# Patient Record
Sex: Male | Born: 1985
Health system: Southern US, Community
[De-identification: ages and names within clinical notes are randomized; demographics above are authoritative.]

## PROBLEM LIST (undated history)

## (undated) DIAGNOSIS — I10 Essential (primary) hypertension: Secondary | ICD-10-CM

---
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
## 2008-08-01 ENCOUNTER — Emergency Department: Payer: Self-pay | Admitting: Internal Medicine

## 2008-12-27 ENCOUNTER — Emergency Department: Payer: Self-pay | Admitting: Emergency Medicine

## 2010-05-08 ENCOUNTER — Emergency Department: Payer: Self-pay | Admitting: Emergency Medicine

## 2011-07-30 ENCOUNTER — Emergency Department: Payer: Self-pay | Admitting: *Deleted

## 2011-07-30 LAB — COMPREHENSIVE METABOLIC PANEL
Anion Gap: 4 — ABNORMAL LOW (ref 7–16)
BUN: 17 mg/dL (ref 7–18)
Calcium, Total: 9.1 mg/dL (ref 8.5–10.1)
Co2: 31 mmol/L (ref 21–32)
Creatinine: 1.27 mg/dL (ref 0.60–1.30)
Osmolality: 278 (ref 275–301)
SGOT(AST): 42 U/L — ABNORMAL HIGH (ref 15–37)

## 2011-07-30 LAB — URINALYSIS, COMPLETE
Bacteria: NONE SEEN
Bilirubin,UR: NEGATIVE
Blood: NEGATIVE
Ketone: NEGATIVE
Leukocyte Esterase: NEGATIVE
Nitrite: NEGATIVE
Ph: 5 (ref 4.5–8.0)
Protein: NEGATIVE
RBC,UR: <1 /HPF (ref 0–5)
Specific Gravity: 1.024 (ref 1.003–1.030)
Squamous Epithelial: <1
WBC UR: 1 /HPF (ref 0–5)

## 2011-07-30 LAB — LIPASE, BLOOD: Lipase: 127 U/L (ref 73–393)

## 2011-07-30 LAB — CBC
Platelet: 149 10*3/uL — ABNORMAL LOW (ref 150–440)
RBC: 4.86 10*6/uL (ref 4.40–5.90)
RDW: 14.4 % (ref 11.5–14.5)
WBC: 8 10*3/uL (ref 3.8–10.6)

## 2012-07-27 ENCOUNTER — Emergency Department: Payer: Self-pay | Admitting: Emergency Medicine

## 2013-01-23 ENCOUNTER — Emergency Department (HOSPITAL_COMMUNITY)
Admission: EM | Admit: 2013-01-23 | Discharge: 2013-01-23 | Discharge status: Against Medical Advice | Payer: Self-pay | Attending: Emergency Medicine | Admitting: Emergency Medicine

## 2013-01-23 ENCOUNTER — Encounter (HOSPITAL_COMMUNITY): Payer: Self-pay | Admitting: Emergency Medicine

## 2013-01-23 DIAGNOSIS — R112 Nausea with vomiting, unspecified: Secondary | ICD-10-CM | POA: Insufficient documentation

## 2013-01-23 DIAGNOSIS — F17200 Nicotine dependence, unspecified, uncomplicated: Secondary | ICD-10-CM | POA: Insufficient documentation

## 2013-01-23 DIAGNOSIS — R197 Diarrhea, unspecified: Secondary | ICD-10-CM | POA: Insufficient documentation

## 2013-01-23 DIAGNOSIS — R109 Unspecified abdominal pain: Secondary | ICD-10-CM | POA: Insufficient documentation

## 2013-01-23 NOTE — ED Notes (Signed)
Pt called back to have labs drawn but pt was not able to be found in main waiting room area

## 2013-01-23 NOTE — ED Notes (Signed)
Per pt sts since this am he has been having abdominal pain, N,V. sts he has been drinking water.

## 2013-08-07 ENCOUNTER — Emergency Department: Payer: Self-pay | Admitting: Emergency Medicine

## 2016-02-03 ENCOUNTER — Emergency Department
Admission: EM | Admit: 2016-02-03 | Discharge: 2016-02-03 | Disposition: A | Discharge status: Home / Self Care | Payer: Self-pay | Attending: Emergency Medicine | Admitting: Emergency Medicine

## 2016-02-03 DIAGNOSIS — F17200 Nicotine dependence, unspecified, uncomplicated: Secondary | ICD-10-CM | POA: Insufficient documentation

## 2016-02-03 DIAGNOSIS — B349 Viral infection, unspecified: Secondary | ICD-10-CM | POA: Insufficient documentation

## 2016-02-03 DIAGNOSIS — I10 Essential (primary) hypertension: Secondary | ICD-10-CM | POA: Insufficient documentation

## 2016-02-03 LAB — CBC
HCT: 45.6 % (ref 40.0–52.0)
Hemoglobin: 15.2 g/dL (ref 13.0–18.0)
MCH: 29.7 pg (ref 26.0–34.0)
MCHC: 33.4 g/dL (ref 32.0–36.0)
MCV: 88.7 fL (ref 80.0–100.0)
Platelets: 159 10*3/uL (ref 150–440)
RBC: 5.14 MIL/uL (ref 4.40–5.90)
RDW: 13.9 % (ref 11.5–14.5)
WBC: 11.3 10*3/uL — ABNORMAL HIGH (ref 3.8–10.6)

## 2016-02-03 LAB — LIPASE, BLOOD: Lipase: 14 U/L (ref 11–51)

## 2016-02-03 LAB — URINALYSIS, COMPLETE (UACMP) WITH MICROSCOPIC
Bacteria, UA: NONE SEEN
Bilirubin Urine: NEGATIVE
GLUCOSE, UA: NEGATIVE mg/dL
Hgb urine dipstick: NEGATIVE
Ketones, ur: NEGATIVE mg/dL
Leukocytes, UA: NEGATIVE
Nitrite: NEGATIVE
PROTEIN: NEGATIVE mg/dL
RBC / HPF: NONE SEEN RBC/hpf (ref 0–5)
SPECIFIC GRAVITY, URINE: 1.02 (ref 1.005–1.030)
pH: 5 (ref 5.0–8.0)

## 2016-02-03 LAB — COMPREHENSIVE METABOLIC PANEL
ALK PHOS: 75 U/L (ref 38–126)
ALT: 15 U/L — ABNORMAL LOW (ref 17–63)
ANION GAP: 5 (ref 5–15)
AST: 21 U/L (ref 15–41)
Albumin: 4.5 g/dL (ref 3.5–5.0)
BUN: 15 mg/dL (ref 6–20)
CALCIUM: 9.5 mg/dL (ref 8.9–10.3)
CHLORIDE: 104 mmol/L (ref 101–111)
CO2: 30 mmol/L (ref 22–32)
Creatinine, Ser: 1.42 mg/dL — ABNORMAL HIGH (ref 0.61–1.24)
GFR calc Af Amer: >60 mL/min (ref >60)
GFR calc non Af Amer: >60 mL/min (ref >60)
Glucose, Bld: 95 mg/dL (ref 65–99)
Potassium: 3.7 mmol/L (ref 3.5–5.1)
SODIUM: 139 mmol/L (ref 135–145)
Total Bilirubin: 0.7 mg/dL (ref 0.3–1.2)
Total Protein: 8 g/dL (ref 6.5–8.1)

## 2016-02-03 MED ORDER — ONDANSETRON 4 MG PO TBDP: ORAL_TABLET | ORAL | 0 refills | Status: DC

## 2016-02-03 NOTE — ED Provider Notes (Signed)
[facility] Emergency Department Provider Note  ____________________________________________   First MD Initiated Contact with Patient 02/03/16 2153     (approximate)  I have reviewed the triage vital signs and the nursing notes.   HISTORY  Chief Complaint Weakness    HPI David Giles is a 30 y.o. male who is otherwise healthy who presents for evaluation of acute onset of viral symptoms that started yesterday.  He reports congestion, watery eyes, runny nose, mild cough, body aches, objective fever, generalized weakness, generalized fatigue that has been moderate to severe in intensity.  Today he felt generally weak and was nauseated and then vomited twice.  He denieschest pain, shortness of breath, abdominal pain, diarrhea, dysuria.  He has felt some aching in his lower back bilaterally and has no history of trauma.  He states he feels like he has the flu.  Nothing is making his symptoms worse nor better.  He has been able to tolerate liquids since vomiting.   No past medical history on file.  There are no active problems to display for this patient.   No past surgical history on file.  Prior to Admission medications   Medication Sig Start Date End Date Taking? Authorizing Provider  ondansetron (ZOFRAN ODT) 4 MG disintegrating tablet Allow 1-2 tablets to dissolve in your mouth every 8 hours as needed for nausea/vomiting 02/03/16   Loleta Roseory Aidee Latimore, MD    Allergies Patient has no known allergies.  No family history on file.  Social History Social History  Substance Use Topics  . Smoking status: Current Every Day Smoker  . Smokeless tobacco: Not on file  . Alcohol use Yes    Review of Systems Constitutional: Subjective fever/chills Eyes: No visual changes.  Watery eyes bilaterally ENT: No sore throat.  Nasal congestion/runny nose Cardiovascular: Denies chest pain. Respiratory: Denies shortness of breath.  Mild nonproductive  cough Gastrointestinal: No abdominal pain.  Emesis 2.  No diarrhea.  No constipation. Genitourinary: Negative for dysuria. Musculoskeletal: Mild bilateral low back pain Skin: Negative for rash. Neurological: Negative for headaches, focal weakness or numbness.  10-point ROS otherwise negative.  ____________________________________________   PHYSICAL EXAM:  VITAL SIGNS: ED Triage Vitals  Enc Vitals Group     BP 02/03/16 2035 (!) 172/110     Pulse Rate 02/03/16 2034 (!) 118     Resp 02/03/16 2034 18     Temp 02/03/16 2034 99.6 F (37.6 C)     Temp Source 02/03/16 2034 Oral     SpO2 02/03/16 2034 96 %     Weight 02/03/16 2034 275 lb (124.7 kg)     Height 02/03/16 2034 5\' 10"  (1.778 m)     Head Circumference --      Peak Flow --      Pain Score 02/03/16 2034 5     Pain Loc --      Pain Edu? --      Excl. in GC? --     Constitutional: Alert and oriented. No acute distress but with obvious URI symptoms Eyes: Conjunctivae are injected bilaterally and watery. PERRL. EOMI. Head: Atraumatic. Nose: No congestion/rhinnorhea. Mouth/Throat: Mucous membranes are moist.  Oropharynx non-erythematous.  No exudate and no petechiae on the palate Neck: No stridor.  No meningeal signs.   Cardiovascular: Normal rate (triage tachycardia resolved), regular rhythm. Good peripheral circulation. Grossly normal heart sounds. Respiratory: Normal respiratory effort.  No retractions. Lungs CTAB. Gastrointestinal: Soft and nontender. No distention.  Musculoskeletal: No lower extremity tenderness nor  edema. No gross deformities of extremities. Neurologic:  Normal speech and language. No gross focal neurologic deficits are appreciated.  Skin:  Skin is warm, dry and intact. No rash noted. Psychiatric: Mood and affect are normal. Speech and behavior are normal.  ____________________________________________   LABS (all labs ordered are listed, but only abnormal results are displayed)  Labs Reviewed   CBC - Abnormal; Notable for the following:       Result Value   WBC 11.3 (*)    All other components within normal limits  COMPREHENSIVE METABOLIC PANEL - Abnormal; Notable for the following:    Creatinine, Ser 1.42 (*)    ALT 15 (*)    All other components within normal limits  URINALYSIS, COMPLETE (UACMP) WITH MICROSCOPIC - Abnormal; Notable for the following:    Color, Urine YELLOW (*)    APPearance CLEAR (*)    Squamous Epithelial / LPF 0-5 (*)    All other components within normal limits  LIPASE, BLOOD   ____________________________________________  EKG  None - EKG not ordered by ED physician ____________________________________________  RADIOLOGY   No results found.  ____________________________________________   PROCEDURES  Procedure(s) performed:   Procedures   Critical Care performed: No ____________________________________________   INITIAL IMPRESSION / ASSESSMENT AND PLAN / ED COURSE  Pertinent labs & imaging results that were available during my care of the patient were reviewed by me and considered in my medical decision making (see chart for details).  The patient has obvious viral symptoms most consistent with a flulike illness.  He has significant hypertension but that is likely chronic and/or related to his acute illness.  There is no benefit to checking influenza at this time given no effective treatment other than normal symptomatic treatment of any other viral illness.  There is no indication that he has an acute bacterial infection.  I had my usual and customary influenza-like illness discussion with the patient including return precautions.  I will provide him with a number for primary care doctor to follow up with.  He has a creatinine of 1.4 today and has lasted all labs was from 4 years ago with a creatinine of 1.2.  His GFR is still greater than 60.  In his paperwork and encouraged him to follow up as an outpatient to discuss his blood  pressure.  ____________________________________________  FINAL CLINICAL IMPRESSION(S) / ED DIAGNOSES  Final diagnoses:  Acute viral syndrome     MEDICATIONS GIVEN DURING THIS VISIT:  Medications - No data to display   NEW OUTPATIENT MEDICATIONS STARTED DURING THIS VISIT:  New Prescriptions   ONDANSETRON (ZOFRAN ODT) 4 MG DISINTEGRATING TABLET    Allow 1-2 tablets to dissolve in your mouth every 8 hours as needed for nausea/vomiting    Modified Medications   No medications on file    Discontinued Medications   No medications on file     Note:  This document was prepared using Dragon voice recognition software and may include unintentional dictation errors.    Loleta Roseory Kavi Almquist, MD 02/03/16 [phone]

## 2016-02-03 NOTE — Discharge Instructions (Signed)
You have been seen in the Emergency Department (ED) today for a likely viral illness.  Please drink plenty of clear fluids (water, Gatorade, chicken broth, etc).  You may use Tylenol and/or Motrin according to label instructions.  You can alternate between the two without any side effects.   Your blood pressure was elevated today too.  This may be normal for you and long-term, or if may be related to your illness, or both.  Please follow up to establish a primary care doctor at the next available opportunity to discuss your blood pressure, have it rechecked, and determine if you need to be on blood pressure medication (better determined when you are not ill with a virus).  Please follow up with your doctor as listed above.  Call your doctor or return to the Emergency Department (ED) if you are unable to tolerate fluids due to vomiting, have worsening trouble breathing, become extremely tired or difficult to awaken, or if you develop any other symptoms that concern you.

## 2016-02-03 NOTE — ED Triage Notes (Signed)
Pt in with co weakness and vomited x 2 today. No co of abd pain, and diarrhea. No dysuria at this time.

## 2018-04-05 ENCOUNTER — Other Ambulatory Visit: Payer: Self-pay

## 2018-04-05 ENCOUNTER — Encounter: Payer: Self-pay | Admitting: Emergency Medicine

## 2018-04-05 ENCOUNTER — Emergency Department
Admission: EM | Admit: 2018-04-05 | Discharge: 2018-04-05 | Disposition: A | Discharge status: Home / Self Care | Payer: Self-pay | Attending: Emergency Medicine | Admitting: Emergency Medicine

## 2018-04-05 DIAGNOSIS — F17210 Nicotine dependence, cigarettes, uncomplicated: Secondary | ICD-10-CM | POA: Insufficient documentation

## 2018-04-05 DIAGNOSIS — S61211A Laceration without foreign body of left index finger without damage to nail, initial encounter: Secondary | ICD-10-CM | POA: Insufficient documentation

## 2018-04-05 DIAGNOSIS — Z23 Encounter for immunization: Secondary | ICD-10-CM | POA: Insufficient documentation

## 2018-04-05 DIAGNOSIS — Y999 Unspecified external cause status: Secondary | ICD-10-CM | POA: Insufficient documentation

## 2018-04-05 DIAGNOSIS — Y939 Activity, unspecified: Secondary | ICD-10-CM | POA: Insufficient documentation

## 2018-04-05 DIAGNOSIS — W260XXA Contact with knife, initial encounter: Secondary | ICD-10-CM | POA: Insufficient documentation

## 2018-04-05 DIAGNOSIS — Y92009 Unspecified place in unspecified non-institutional (private) residence as the place of occurrence of the external cause: Secondary | ICD-10-CM | POA: Insufficient documentation

## 2018-04-05 MED ORDER — TETANUS-DIPHTH-ACELL PERTUSSIS 5-2.5-18.5 LF-MCG/0.5 IM SUSP
0.5000 mL | Freq: Once | INTRAMUSCULAR | Status: AC
  Administered 2018-04-05: 0.5 mL via INTRAMUSCULAR
  Filled 2018-04-05: qty 0.5

## 2018-04-05 MED ORDER — LIDOCAINE HCL (PF) 1 % IJ SOLN
INTRAMUSCULAR | Status: AC
  Filled 2018-04-05: qty 5

## 2018-04-05 MED ORDER — BACITRACIN-NEOMYCIN-POLYMYXIN 400-5-5000 EX OINT
TOPICAL_OINTMENT | Freq: Once | CUTANEOUS | Status: AC
  Administered 2018-04-05: 1 via TOPICAL
  Filled 2018-04-05: qty 1

## 2018-04-05 MED ORDER — LIDOCAINE HCL 2 % IJ SOLN
5.0000 mL | Freq: Once | INTRAMUSCULAR | Status: DC
  Administered 2018-04-05: 100 mg
  Filled 2018-04-05: qty 5

## 2018-04-05 MED ORDER — LIDOCAINE HCL (PF) 1 % IJ SOLN: 5.0000 mL | Freq: Once | INTRAMUSCULAR | Status: DC

## 2018-04-05 NOTE — ED Provider Notes (Signed)
[facility] Emergency Department Provider Note ____________________________________________  Time seen: 2000  I have reviewed the triage vital signs and the nursing notes.  HISTORY  Chief Complaint  Laceration   HPI David Giles is a 33 y.o. male presents to the ER today with complaint of laceration to left index finger.  He reports this occurred at approximately 12:00 today.  He cut his finger on his pocket knife.  He was able to control the bleeding at home.  He reports pain but denies numbness, tingling or weakness.  He has not taken anything over-the-counter for pain prior to arrival.  He does not know the last time he had a Tdap.  History reviewed. No pertinent past medical history.  There are no active problems to display for this patient.   History reviewed. No pertinent surgical history.  Prior to Admission medications   Medication Sig Start Date End Date Taking? Authorizing Provider  ondansetron (ZOFRAN ODT) 4 MG disintegrating tablet Allow 1-2 tablets to dissolve in your mouth every 8 hours as needed for nausea/vomiting 02/03/16   Loleta Rose, MD    Allergies Patient has no known allergies.  No family history on file.  Social History Social History   Tobacco Use  . Smoking status: Current Every Day Smoker  . Smokeless tobacco: Never Used  Substance Use Topics  . Alcohol use: Yes  . Drug use: Yes    Types: Marijuana    Review of Systems  Constitutional: Negative for fever. Cardiovascular: Negative for chest pain or chest tightness. Respiratory: Negative for shortness of breath. Musculoskeletal: Positive for finger pain.   Skin: Positive for laceration to right index finger. Neurological: Negative for focal weakness, tingling or numbness. ____________________________________________  PHYSICAL EXAM:  VITAL SIGNS: ED Triage Vitals  Enc Vitals Group     BP 04/05/18 1909 (!) 171/115     Pulse Rate 04/05/18 1909 80     Resp  04/05/18 1909 15     Temp 04/05/18 1909 98.5 F (36.9 C)     Temp Source 04/05/18 1909 Oral     SpO2 04/05/18 1909 98 %     Weight 04/05/18 1844 274 lb 14.6 oz (124.7 kg)     Height 04/05/18 1844 5\' 10"  (1.778 m)     Head Circumference --      Peak Flow --      Pain Score 04/05/18 1844 8     Pain Loc --      Pain Edu? --      Excl. in GC? --     Constitutional: Alert and oriented. Well appearing and in no distress. Cardiovascular: Normal rate, regular rhythm.  Radial pulses 2+ bilaterally.  Cap refill less than 3 seconds left index finger. Respiratory: Normal respiratory effort. No wheezes/rales/rhonchi. Musculoskeletal: Normal flexion and extension of the left index finger.  No joint swelling noted.  Neurologic: Sensation intact to BUE. Skin: 1.5 cm V shaped laceration to left index finger. ____________________________________________  PROCEDURES  .Marland KitchenLaceration Repair Date/Time: 04/05/2018 8:46 PM Performed by: Lorre Munroe, NP Authorized by: Lorre Munroe, NP   Consent:    Consent obtained:  Verbal   Consent given by:  Patient   Risks discussed:  Infection, need for additional repair, pain, poor cosmetic result and poor wound healing   Alternatives discussed:  No treatment and delayed treatment Universal protocol:    Procedure explained and questions answered to patient or proxy's satisfaction: yes     Relevant documents present and verified:  yes     Test results available and properly labeled: yes     Imaging studies available: yes     Required blood products, implants, devices, and special equipment available: yes     Site/side marked: yes     Immediately prior to procedure, a time out was called: yes     Patient identity confirmed:  Verbally with patient Anesthesia (see MAR for exact dosages):    Anesthesia method:  Local infiltration   Local anesthetic:  Lidocaine 1% w/o epi Laceration details:    Location:  Finger   Finger location:  L index finger   Length  (cm):  1.5 Repair type:    Repair type:  Simple Pre-procedure details:    Preparation:  Patient was prepped and draped in usual sterile fashion Exploration:    Hemostasis achieved with:  Direct pressure   Wound exploration: wound explored through full range of motion and entire depth of wound probed and visualized     Contaminated: no   Treatment:    Area cleansed with:  Betadine and saline   Amount of cleaning:  Standard   Irrigation solution:  Sterile saline   Irrigation method:  Syringe Skin repair:    Repair method:  Sutures   Suture size:  5-0   Suture material:  Nylon   Suture technique:  Simple interrupted   Number of sutures:  6 Approximation:    Approximation:  Close Post-procedure details:    Dressing:  Antibiotic ointment   Patient tolerance of procedure:  Tolerated well, no immediate complications   ____________________________________________  INITIAL IMPRESSION / ASSESSMENT AND PLAN / ED COURSE  Laceration Left Index Finger:  Tdap given today Sutures placed- see procedure note Aftercare instructions provided Advised him to return to ED or go to UC in 1 week for suture removal ____________________________________________  FINAL CLINICAL IMPRESSION(S) / ED DIAGNOSES  Final diagnoses:  Laceration of left index finger without foreign body without damage to nail, initial encounter    Nicki Reaper, NP    Lorre Munroe, NP 04/05/18 2103    Dionne Bucy, MD 04/05/18 [phone]

## 2018-04-05 NOTE — ED Notes (Signed)
Pt has was trying to open up his bedroom door with a knife when his hand slipped and cut his pointer finger on his right hand.

## 2018-04-05 NOTE — Discharge Instructions (Addendum)
You were seen today for finger laceration.  We gave you a tetanus booster today.  You received 6 stitches in your left index finger which will need to be removed in approximately 1 week.  Please follow-up sooner if you notice increased pain, swelling, redness, warmth or drainage from the area.  You may take her dressing off in about 2 hours.  After that keep area open to air.  You may apply Neosporin twice daily as needed.

## 2018-04-05 NOTE — ED Triage Notes (Signed)
C/O laceration to right index finger.     Bleeding controlled.  DSD applied.

## 2018-04-23 ENCOUNTER — Other Ambulatory Visit: Payer: Self-pay

## 2018-04-23 ENCOUNTER — Emergency Department
Admission: EM | Admit: 2018-04-23 | Discharge: 2018-04-23 | Disposition: A | Discharge status: Home / Self Care | Payer: Self-pay | Attending: Emergency Medicine | Admitting: Emergency Medicine

## 2018-04-23 DIAGNOSIS — A084 Viral intestinal infection, unspecified: Secondary | ICD-10-CM | POA: Insufficient documentation

## 2018-04-23 DIAGNOSIS — F17200 Nicotine dependence, unspecified, uncomplicated: Secondary | ICD-10-CM | POA: Insufficient documentation

## 2018-04-23 LAB — CBC WITH DIFFERENTIAL/PLATELET
Abs Immature Granulocytes: 0.01 10*3/uL (ref 0.00–0.07)
BASOS ABS: 0 10*3/uL (ref 0.0–0.1)
Basophils Relative: 1 %
Eosinophils Absolute: 0.2 10*3/uL (ref 0.0–0.5)
Eosinophils Relative: 3 %
HEMATOCRIT: 41.6 % (ref 39.0–52.0)
HEMOGLOBIN: 13.8 g/dL (ref 13.0–17.0)
Immature Granulocytes: 0 %
LYMPHS ABS: 2.1 10*3/uL (ref 0.7–4.0)
Lymphocytes Relative: 36 %
MCH: 29.1 pg (ref 26.0–34.0)
MCHC: 33.2 g/dL (ref 30.0–36.0)
MCV: 87.8 fL (ref 80.0–100.0)
MONO ABS: 0.5 10*3/uL (ref 0.1–1.0)
Monocytes Relative: 8 %
Neutro Abs: 3 10*3/uL (ref 1.7–7.7)
Neutrophils Relative %: 52 %
Platelets: 189 10*3/uL (ref 150–400)
RBC: 4.74 MIL/uL (ref 4.22–5.81)
RDW: 13 % (ref 11.5–15.5)
WBC: 5.7 10*3/uL (ref 4.0–10.5)
nRBC: 0 % (ref 0.0–0.2)

## 2018-04-23 LAB — URINALYSIS, COMPLETE (UACMP) WITH MICROSCOPIC
BACTERIA UA: NONE SEEN
BILIRUBIN URINE: NEGATIVE
Glucose, UA: NEGATIVE mg/dL
HGB URINE DIPSTICK: NEGATIVE
Ketones, ur: NEGATIVE mg/dL
LEUKOCYTE UA: NEGATIVE
NITRITE: NEGATIVE
Protein, ur: NEGATIVE mg/dL
SPECIFIC GRAVITY, URINE: 1.024 (ref 1.005–1.030)
Squamous Epithelial / LPF: NONE SEEN (ref 0–5)
pH: 6 (ref 5.0–8.0)

## 2018-04-23 LAB — COMPREHENSIVE METABOLIC PANEL
ALT: 18 U/L (ref 0–44)
ANION GAP: 7 (ref 5–15)
AST: 26 U/L (ref 15–41)
Albumin: 4.3 g/dL (ref 3.5–5.0)
Alkaline Phosphatase: 55 U/L (ref 38–126)
BILIRUBIN TOTAL: 0.4 mg/dL (ref 0.3–1.2)
BUN: 18 mg/dL (ref 6–20)
CO2: 29 mmol/L (ref 22–32)
Calcium: 8.8 mg/dL — ABNORMAL LOW (ref 8.9–10.3)
Chloride: 105 mmol/L (ref 98–111)
Creatinine, Ser: 1.19 mg/dL (ref 0.61–1.24)
GFR calc Af Amer: >60 mL/min (ref >60)
Glucose, Bld: 92 mg/dL (ref 70–99)
Potassium: 4 mmol/L (ref 3.5–5.1)
SODIUM: 141 mmol/L (ref 135–145)
Total Protein: 7.1 g/dL (ref 6.5–8.1)

## 2018-04-23 LAB — LIPASE, BLOOD: Lipase: 25 U/L (ref 11–51)

## 2018-04-23 MED ORDER — ONDANSETRON 4 MG PO TBDP: 4.0000 mg | ORAL_TABLET | Freq: Three times a day (TID) | ORAL | 0 refills | Status: DC | PRN

## 2018-04-23 MED ORDER — ONDANSETRON HCL 4 MG/2ML IJ SOLN
4.0000 mg | Freq: Once | INTRAMUSCULAR | Status: AC
  Administered 2018-04-23: 4 mg via INTRAVENOUS
  Filled 2018-04-23: qty 2

## 2018-04-23 MED ORDER — SODIUM CHLORIDE 0.9 % IV BOLUS
1000.0000 mL | Freq: Once | INTRAVENOUS | Status: AC
  Administered 2018-04-23: 1000 mL via INTRAVENOUS

## 2018-04-23 NOTE — ED Triage Notes (Signed)
Pt states "I think I have a stomach virus." symptoms began yesterday. 4 episodes of diarrhea, 3 episodes of vomiting. Denies fever. A&), ambulatory.

## 2018-04-23 NOTE — ED Notes (Signed)
Patient requesting to leave by 1300.

## 2018-04-23 NOTE — ED Provider Notes (Signed)
[facility] Emergency Department Provider Note  ____________________________________________  Time seen: Approximately 12:50 PM  I have reviewed the triage vital signs and the nursing notes.   HISTORY  Chief Complaint Diarrhea and Emesis   HPI David Giles is a 32 y.o. male with no significant past medical history who presents for evaluation of vomiting and diarrhea.  Patient reports that his symptoms started yesterday.  3 episodes of nonbloody nonbilious emesis and 4 of watery diarrhea.  No fever or chills, no cough, no chest pain or shortness of breath, no abdominal pain, no dysuria or hematuria.  Patient reports that his partner has had similar symptoms.  PMH None - reviewed  Prior to Admission medications   Medication Sig Start Date End Date Taking? Authorizing Provider  ondansetron (ZOFRAN ODT) 4 MG disintegrating tablet Take 1 tablet (4 mg total) by mouth every 8 (eight) hours as needed. 04/23/18   Nita Sickle, MD    Allergies Patient has no known allergies.  FH Heart attack Father    Hypertension Father    Stroke Father       Social History Social History   Tobacco Use  . Smoking status: Current Every Day Smoker  . Smokeless tobacco: Never Used  Substance Use Topics  . Alcohol use: Yes  . Drug use: Yes    Types: Marijuana    Review of Systems  Constitutional: Negative for fever. Eyes: Negative for visual changes. ENT: Negative for sore throat. Neck: No neck pain  Cardiovascular: Negative for chest pain. Respiratory: Negative for shortness of breath. Gastrointestinal: Negative for abdominal pain. + vomiting and diarrhea. Genitourinary: Negative for dysuria. Musculoskeletal: Negative for back pain. Skin: Negative for rash. Neurological: Negative for headaches, weakness or numbness. Psych: No SI or HI  ____________________________________________   PHYSICAL EXAM:  VITAL SIGNS: ED Triage Vitals  Enc Vitals  Group     BP 04/23/18 1148 (!) 174/120     Pulse Rate 04/23/18 1148 72     Resp 04/23/18 1148 16     Temp 04/23/18 1148 97.7 F (36.5 C)     Temp Source 04/23/18 1148 Oral     SpO2 04/23/18 1148 100 %     Weight 04/23/18 1148 245 lb (111.1 kg)     Height 04/23/18 1148 5\' 10"  (1.778 m)     Head Circumference --      Peak Flow --      Pain Score 04/23/18 1147 0     Pain Loc --      Pain Edu? --      Excl. in GC? --     Constitutional: Alert and oriented. Well appearing and in no apparent distress. HEENT:      Head: Normocephalic and atraumatic.         Eyes: Conjunctivae are normal. Sclera is non-icteric.       Mouth/Throat: Mucous membranes are moist.       Neck: Supple with no signs of meningismus. Cardiovascular: Regular rate and rhythm. No murmurs, gallops, or rubs. 2+ symmetrical distal pulses are present in all extremities. No JVD. Respiratory: Normal respiratory effort. Lungs are clear to auscultation bilaterally. No wheezes, crackles, or rhonchi.  Gastrointestinal: Soft, non tender, and non distended with positive bowel sounds. No rebound or guarding. Genitourinary: No CVA tenderness. Musculoskeletal: Nontender with normal range of motion in all extremities. No edema, cyanosis, or erythema of extremities. Neurologic: Normal speech and language. Face is symmetric. Moving all extremities. No gross focal neurologic deficits  are appreciated. Skin: Skin is warm, dry and intact. No rash noted. Psychiatric: Mood and affect are normal. Speech and behavior are normal.  ____________________________________________   LABS (all labs ordered are listed, but only abnormal results are displayed)  Labs Reviewed  COMPREHENSIVE METABOLIC PANEL - Abnormal; Notable for the following components:      Result Value   Calcium 8.8 (*)    All other components within normal limits  URINALYSIS, COMPLETE (UACMP) WITH MICROSCOPIC - Abnormal; Notable for the following components:   Color, Urine  YELLOW (*)    APPearance CLEAR (*)    All other components within normal limits  LIPASE, BLOOD  CBC WITH DIFFERENTIAL/PLATELET   ____________________________________________  EKG  none  ____________________________________________  RADIOLOGY  none  ____________________________________________   PROCEDURES  Procedure(s) performed: None Procedures Critical Care performed:  None ____________________________________________   INITIAL IMPRESSION / ASSESSMENT AND PLAN / ED COURSE  34 y.o. male with no significant past medical history who presents for evaluation of vomiting and diarrhea.  Patient is extremely well-appearing in no distress, has normal vital signs, abdomen is soft with no tenderness throughout.  Labs with no evidence of dehydration, AKI, electrolyte abnormalities, or sepsis.  Patient received fluids and Zofran, he is tolerating p.o. with no further episodes of vomiting.  Will discharge home with bland diet, increase oral hydration, Zofran for nausea and vomiting follow-up with primary care doctor.  Discussed and return precautions.      As part of my medical decision making, I reviewed the following data within the electronic MEDICAL RECORD NUMBER Nursing notes reviewed and incorporated, Labs reviewed , Old chart reviewed, Notes from prior ED visits and  Controlled Substance Database    Pertinent labs & imaging results that were available during my care of the patient were reviewed by me and considered in my medical decision making (see chart for details).    ____________________________________________   FINAL CLINICAL IMPRESSION(S) / ED DIAGNOSES  Final diagnoses:  Viral gastroenteritis      NEW MEDICATIONS STARTED DURING THIS VISIT:  ED Discharge Orders         Ordered    ondansetron (ZOFRAN ODT) 4 MG disintegrating tablet  Every 8 hours PRN     04/23/18 1250           Note:  This document was prepared using Dragon voice recognition software  and may include unintentional dictation errors.    Don Perking, [place], MD 04/23/18 [phone]

## 2018-04-23 NOTE — ED Triage Notes (Signed)
Diarrhea and vomiting since last night.  Says no fever.  Says one other family member with same.

## 2018-04-23 NOTE — ED Notes (Signed)
Patient was able to keep down 8 oz of water.

## 2018-08-10 ENCOUNTER — Other Ambulatory Visit: Payer: Self-pay

## 2018-08-10 ENCOUNTER — Emergency Department
Admission: EM | Admit: 2018-08-10 | Discharge: 2018-08-10 | Disposition: A | Discharge status: Home / Self Care | Payer: Self-pay | Attending: Emergency Medicine | Admitting: Emergency Medicine

## 2018-08-10 ENCOUNTER — Encounter: Payer: Self-pay | Admitting: Emergency Medicine

## 2018-08-10 DIAGNOSIS — F17200 Nicotine dependence, unspecified, uncomplicated: Secondary | ICD-10-CM | POA: Insufficient documentation

## 2018-08-10 DIAGNOSIS — R369 Urethral discharge, unspecified: Secondary | ICD-10-CM | POA: Insufficient documentation

## 2018-08-10 LAB — URINALYSIS, COMPLETE (UACMP) WITH MICROSCOPIC
Bacteria, UA: NONE SEEN
Bilirubin Urine: NEGATIVE
Glucose, UA: NEGATIVE mg/dL
Hgb urine dipstick: NEGATIVE
Ketones, ur: NEGATIVE mg/dL
Nitrite: NEGATIVE
Protein, ur: NEGATIVE mg/dL
Specific Gravity, Urine: 1.027 (ref 1.005–1.030)
Squamous Epithelial / HPF: NONE SEEN (ref 0–5)
pH: 5 (ref 5.0–8.0)

## 2018-08-10 LAB — CHLAMYDIA/NGC RT PCR (ARMC ONLY)
Chlamydia Tr: NOT DETECTED
N gonorrhoeae: NOT DETECTED

## 2018-08-10 MED ORDER — CEFTRIAXONE SODIUM 250 MG IJ SOLR
250.0000 mg | Freq: Once | INTRAMUSCULAR | Status: AC
  Administered 2018-08-10: 250 mg via INTRAMUSCULAR
  Filled 2018-08-10: qty 250

## 2018-08-10 MED ORDER — AZITHROMYCIN 500 MG PO TABS
1000.0000 mg | ORAL_TABLET | Freq: Once | ORAL | Status: AC
  Administered 2018-08-10: 1000 mg via ORAL
  Filled 2018-08-10: qty 2

## 2018-08-10 MED ORDER — DOXYCYCLINE HYCLATE 100 MG PO TABS: 100.0000 mg | ORAL_TABLET | Freq: Two times a day (BID) | ORAL | 0 refills | Status: DC

## 2018-08-10 NOTE — ED Triage Notes (Signed)
Milky white discharge from penis x 1 week.

## 2018-08-10 NOTE — ED Provider Notes (Signed)
[facility] Emergency Department Provider Note  ____________________________________________  Time seen: Approximately 3:47 PM  I have reviewed the triage vital signs and the nursing notes.   HISTORY  Chief Complaint Penile Discharge    HPI David Giles is a 33 y.o. male who presents emergency department complaining of a week penile discharge.  Patient reports that he has had penile discharge with urination as well as observed after waking.  Patient denies any dysuria, hematuria, abdominal pain, flank pain.  Patient was treated for STDs 2 months ago.  Patient reports at that time he had burning with urination.  No other complaints at this time.         History reviewed. No pertinent past medical history.  There are no active problems to display for this patient.   History reviewed. No pertinent surgical history.  Prior to Admission medications   Medication Sig Start Date End Date Taking? Authorizing Provider  doxycycline (VIBRA-TABS) 100 MG tablet Take 1 tablet (100 mg total) by mouth 2 (two) times daily. 08/10/18   , Delorise RoyalsJonathan D, PA-C  ondansetron (ZOFRAN ODT) 4 MG disintegrating tablet Take 1 tablet (4 mg total) by mouth every 8 (eight) hours as needed. 04/23/18   Nita SickleVeronese, [place], MD    Allergies Patient has no known allergies.  No family history on file.  Social History Social History   Tobacco Use  . Smoking status: Current Every Day Smoker  . Smokeless tobacco: Never Used  Substance Use Topics  . Alcohol use: Yes  . Drug use: Yes    Types: Marijuana     Review of Systems  Constitutional: No fever/chills Eyes: No visual changes. No discharge ENT: No upper respiratory complaints. Cardiovascular: no chest pain. Respiratory: no cough. No SOB. Gastrointestinal: No abdominal pain.  No nausea, no vomiting.  No diarrhea.  No constipation. Genitourinary: Negative for dysuria. No hematuria.  Positive for penile discharge.   Musculoskeletal: Negative for musculoskeletal pain. Skin: Negative for rash, abrasions, lacerations, ecchymosis. Neurological: Negative for headaches, focal weakness or numbness. 10-point ROS otherwise negative.  ____________________________________________   PHYSICAL EXAM:  VITAL SIGNS: ED Triage Vitals  Enc Vitals Group     BP 08/10/18 1500 (!) 143/99     Pulse Rate 08/10/18 1500 78     Resp 08/10/18 1500 20     Temp 08/10/18 1500 98.1 F (36.7 C)     Temp Source 08/10/18 1500 Oral     SpO2 08/10/18 1500 96 %     Weight 08/10/18 1502 265 lb (120.2 kg)     Height 08/10/18 1502 5\' 10"  (1.778 m)     Head Circumference --      Peak Flow --      Pain Score 08/10/18 1502 0     Pain Loc --      Pain Edu? --      Excl. in GC? --      Constitutional: Alert and oriented. Well appearing and in no acute distress. Eyes: Conjunctivae are normal. PERRL. EOMI. Head: Atraumatic. ENT:      Ears:       Nose: No congestion/rhinnorhea.      Mouth/Throat: Mucous membranes are moist.  Neck: No stridor.    Cardiovascular: Normal rate, regular rhythm. Normal S1 and S2.  Good peripheral circulation. Respiratory: Normal respiratory effort without tachypnea or retractions. Lungs CTAB. Good air entry to the bases with no decreased or absent breath sounds. Gastrointestinal: Bowel sounds 4 quadrants. Soft and nontender to palpation. No  guarding or rigidity. No palpable masses. No distention. No CVA tenderness. Musculoskeletal: Full range of motion to all extremities. No gross deformities appreciated. Neurologic:  Normal speech and language. No gross focal neurologic deficits are appreciated.  Skin:  Skin is warm, dry and intact. No rash noted. Psychiatric: Mood and affect are normal. Speech and behavior are normal. Patient exhibits appropriate insight and judgement.   ____________________________________________   LABS (all labs ordered are listed, but only abnormal results are  displayed)  Labs Reviewed  URINALYSIS, COMPLETE (UACMP) WITH MICROSCOPIC - Abnormal; Notable for the following components:      Result Value   Color, Urine YELLOW (*)    APPearance HAZY (*)    Leukocytes,Ua SMALL (*)    All other components within normal limits  CHLAMYDIA/NGC RT PCR ([facility] ONLY)   ____________________________________________  EKG   ____________________________________________  RADIOLOGY   No results found.  ____________________________________________    PROCEDURES  Procedure(s) performed:    Procedures    Medications  cefTRIAXone (ROCEPHIN) injection 250 mg (has no administration in time range)  azithromycin (ZITHROMAX) tablet 1,000 mg (has no administration in time range)     ____________________________________________   INITIAL IMPRESSION / ASSESSMENT AND PLAN / ED COURSE  Pertinent labs & imaging results that were available during my care of the patient were reviewed by me and considered in my medical decision making (see chart for details).  Review of the [place] CSRS was performed in accordance of the [place] prior to dispensing any controlled drugs.           Patient's diagnosis is consistent with anal discharge.  Patient presented to the emergency department complaining of a week of milky discharge.  Patient denies any dysuria, polyuria, hematuria.  Patient was treated recently for STDs.  Chlamydia and gonorrhea tests are have not returned at time of discharge.  Patient will be treated empirically with Rocephin and Zithromax.  I will also cover the patient with a week's worth of doxycycline for NGU, specifically for mycoplasma urealyticum.  Patient is to follow-up primary care or health department.  Patient is given ED precautions to return to the ED for any worsening or new symptoms.     ____________________________________________  FINAL CLINICAL IMPRESSION(S) / ED DIAGNOSES  Final diagnoses:  Penile discharge       NEW MEDICATIONS STARTED DURING THIS VISIT:  ED Discharge Orders         Ordered    doxycycline (VIBRA-TABS) 100 MG tablet  2 times daily     08/10/18 1635              This chart was dictated using voice recognition software/Dragon. Despite best efforts to proofread, errors can occur which can change the meaning. Any change was purely unintentional.    Darletta Moll, PA-C 08/10/18 1659    Schuyler Amor, MD 08/10/18 2251

## 2018-08-10 NOTE — ED Notes (Signed)
Pt c/o penile discharge since Tuesday. Pt asking if he will be out of here by 5pm. Informed him that I am not sure at this time. Informed him the provider would be in to see him as soon as possible. Advised pt he could follow up at health department as well during the week for further testing and for future issues if needed.

## 2019-08-15 ENCOUNTER — Encounter: Payer: Self-pay | Admitting: Emergency Medicine

## 2019-08-15 ENCOUNTER — Other Ambulatory Visit: Payer: Self-pay

## 2019-08-15 ENCOUNTER — Emergency Department
Admission: EM | Admit: 2019-08-15 | Discharge: 2019-08-15 | Disposition: A | Discharge status: Home / Self Care | Payer: Self-pay | Attending: Student in an Organized Health Care Education/Training Program | Admitting: Student in an Organized Health Care Education/Training Program

## 2019-08-15 ENCOUNTER — Emergency Department: Payer: Self-pay

## 2019-08-15 DIAGNOSIS — F17200 Nicotine dependence, unspecified, uncomplicated: Secondary | ICD-10-CM | POA: Insufficient documentation

## 2019-08-15 DIAGNOSIS — R079 Chest pain, unspecified: Secondary | ICD-10-CM | POA: Insufficient documentation

## 2019-08-15 LAB — BASIC METABOLIC PANEL
Anion gap: 8 (ref 5–15)
BUN: 20 mg/dL (ref 6–20)
CO2: 28 mmol/L (ref 22–32)
Calcium: 9 mg/dL (ref 8.9–10.3)
Chloride: 105 mmol/L (ref 98–111)
Creatinine, Ser: 1.29 mg/dL — ABNORMAL HIGH (ref 0.61–1.24)
GFR calc Af Amer: >60 mL/min (ref >60)
GFR calc non Af Amer: >60 mL/min (ref >60)
Glucose, Bld: 89 mg/dL (ref 70–99)
Potassium: 3.9 mmol/L (ref 3.5–5.1)
Sodium: 141 mmol/L (ref 135–145)

## 2019-08-15 LAB — CBC
HCT: 38.3 % — ABNORMAL LOW (ref 39.0–52.0)
Hemoglobin: 13.4 g/dL (ref 13.0–17.0)
MCH: 30 pg (ref 26.0–34.0)
MCHC: 35 g/dL (ref 30.0–36.0)
MCV: 85.9 fL (ref 80.0–100.0)
Platelets: 160 10*3/uL (ref 150–400)
RBC: 4.46 MIL/uL (ref 4.22–5.81)
RDW: 13.2 % (ref 11.5–15.5)
WBC: 6.5 10*3/uL (ref 4.0–10.5)
nRBC: 0 % (ref 0.0–0.2)

## 2019-08-15 LAB — TROPONIN I (HIGH SENSITIVITY)
Troponin I (High Sensitivity): 7 ng/L (ref <18)
Troponin I (High Sensitivity): 6 ng/L (ref <18)

## 2019-08-15 MED ORDER — HYDROCODONE-ACETAMINOPHEN 5-325 MG PO TABS: 1.0000 | ORAL_TABLET | ORAL | 0 refills | Status: DC | PRN

## 2019-08-15 MED ORDER — HYDROCODONE-ACETAMINOPHEN 5-325 MG PO TABS
1.0000 | ORAL_TABLET | Freq: Once | ORAL | Status: AC
  Administered 2019-08-15: 1 via ORAL
  Filled 2019-08-15: qty 1

## 2019-08-15 MED ORDER — CYCLOBENZAPRINE HCL 5 MG PO TABS: 5.0000 mg | ORAL_TABLET | Freq: Three times a day (TID) | ORAL | 0 refills | Status: DC | PRN

## 2019-08-15 MED ORDER — CYCLOBENZAPRINE HCL 10 MG PO TABS
5.0000 mg | ORAL_TABLET | Freq: Once | ORAL | Status: AC
  Administered 2019-08-15: 5 mg via ORAL
  Filled 2019-08-15: qty 1

## 2019-08-15 NOTE — ED Notes (Signed)
Pt ambulated to restroom without difficulty or distress.

## 2019-08-15 NOTE — ED Provider Notes (Signed)
[facility] Emergency Department Provider Note    First MD Initiated Contact with Patient 08/15/19 1458     (approximate)  I have reviewed the triage vital signs and the nursing notes.   HISTORY  Chief Complaint Chest Pain    HPI David Giles is a 34 y.o. male previously healthy young male who presents to the ER for evaluation of 3 days of right-sided chest pain worsened with motion.  States he does smoke 1 pack/day but is not felt any worsening shortness of breath wheezing or fevers.  No cough.  Denies any abdominal pain.  No nausea or vomiting.  Denies any pain ripping or tearing through to his back.  Denies any history of blood clots.  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  Denies feeling any popping sensation.    History reviewed. No pertinent past medical history. No family history on file. History reviewed. No pertinent surgical history. There are no problems to display for this patient.     Prior to Admission medications   Medication Sig Start Date End Date Taking? Authorizing Provider  doxycycline (VIBRA-TABS) 100 MG tablet Take 1 tablet (100 mg total) by mouth 2 (two) times daily. 08/10/18   David Giles, David Royals, PA-C  ondansetron (ZOFRAN ODT) 4 MG disintegrating tablet Take 1 tablet (4 mg total) by mouth every 8 (eight) hours as needed. 04/23/18   David Sickle, MD    Allergies Patient has no known allergies.    Social History Social History   Tobacco Use  . Smoking status: Current Every Day Smoker  . Smokeless tobacco: Never Used  Substance Use Topics  . Alcohol use: Yes  . Drug use: Yes    Types: Marijuana    Review of Systems Patient denies headaches, rhinorrhea, blurry vision, numbness, shortness of breath, chest pain, edema, cough, abdominal pain, nausea, vomiting, diarrhea, dysuria, fevers, rashes or hallucinations unless otherwise stated above in  HPI. ____________________________________________   PHYSICAL EXAM:  VITAL SIGNS: Vitals:   08/15/19 1116 08/15/19 1411  BP: (!) 168/110 (!) 158/110  Pulse: 74 64  Resp: 16 16  Temp: 98.2 F (36.8 C) 98.4 F (36.9 C)  SpO2: 97% 100%    Constitutional: Alert and oriented.  Eyes: Conjunctivae are normal.  Head: Atraumatic. Nose: No congestion/rhinnorhea. Mouth/Throat: Mucous membranes are moist.   Neck: No stridor. Painless ROM.  Cardiovascular: Normal rate, regular rhythm. Grossly normal heart sounds.  Good peripheral circulation. Respiratory: Normal respiratory effort.  No retractions. Lungs CTAB. Gastrointestinal: Soft and nontender. No distention. No abdominal bruits. No CVA tenderness. Genitourinary:  Musculoskeletal pain reproduced with palpation of the right pectoralis muscle.  No overlying cellulitis or crepitus.  No lower extremity tenderness nor edema.  No joint effusions. Neurologic:  Normal speech and language. No gross focal neurologic deficits are appreciated. No facial droop Skin:  Skin is warm, dry and intact. No rash noted. Psychiatric: Mood and affect are normal. Speech and behavior are normal.  ____________________________________________   LABS (all labs ordered are listed, but only abnormal results are displayed)  Results for orders placed or performed during the hospital encounter of 08/15/19 (from the past 24 hour(s))  Basic metabolic panel     Status: Abnormal   Collection Time: 08/15/19 11:25 AM  Result Value Ref Range   Sodium 141 135 - 145 mmol/L   Potassium 3.9 3.5 - 5.1 mmol/L   Chloride 105 98 - 111 mmol/L   CO2 28 22 -  32 mmol/L   Glucose, Bld 89 70 - 99 mg/dL   BUN 20 6 - 20 mg/dL   Creatinine, Ser 6.04 (H) 0.61 - 1.24 mg/dL   Calcium 9.0 8.9 - 54.0 mg/dL   GFR calc non Af Amer >60 >60 mL/min   GFR calc Af Amer >60 >60 mL/min   Anion gap 8 5 - 15  CBC     Status: Abnormal   Collection Time: 08/15/19 11:25 AM  Result Value Ref Range    WBC 6.5 4.0 - 10.5 K/uL   RBC 4.46 4.22 - 5.81 MIL/uL   Hemoglobin 13.4 13.0 - 17.0 g/dL   HCT 98.1 (L) 39 - 52 %   MCV 85.9 80.0 - 100.0 fL   MCH 30.0 26.0 - 34.0 pg   MCHC 35.0 30.0 - 36.0 g/dL   RDW 19.1 47.8 - 29.5 %   Platelets 160 150 - 400 K/uL   nRBC 0.0 0.0 - 0.2 %  Troponin I (High Sensitivity)     Status: None   Collection Time: 08/15/19 11:25 AM  Result Value Ref Range   Troponin I (High Sensitivity) 7 <18 ng/L   ____________________________________________  EKG My review and personal interpretation at Time:   11:11 Indication: Chest pain rate: 70 rhythm: Sinus Axis: rightward Other: Normal intervals, no stemi, no depressions ____________________________________________  RADIOLOGY  I personally reviewed all radiographic images ordered to evaluate for the above acute complaints and reviewed radiology reports and findings.  These findings were personally discussed with the patient.  Please see medical record for radiology report.  ____________________________________________   PROCEDURES  Procedure(s) performed:  Procedures    Critical Care performed: no ____________________________________________   INITIAL IMPRESSION / ASSESSMENT AND PLAN / ED COURSE  Pertinent labs & imaging results that were available during my care of the patient were reviewed by me and considered in my medical decision making (see chart for details).   DDX: ACS, pericarditis, esophagitis, boerhaaves, pe, dissection, pna, bronchitis, costochondritis   David Giles is a 34 y.o. who presents to the ED with symptoms as described above.  Patient is clinically very well-appearing now 3 days of persistent pain.  Initial troponin is negative.  EKG is nonischemic.  He is low risk by Wells criteria and is PERC negative.  His abdominal exam is soft and benign.  No white count.  I do suspect musculoskeletal strain.  Mildly elevated BP likely secondary to pain.  States history of BP being  elevated when visiting hospitalist and clinics.  Discussed need for follow up with PCP for repeat BP check.  Will treat with muscle laxatives as well as anti-inflammatory pain medication.  This does not seem consistent with acute intra-abdominal process, infection, pneumothorax, dissection.  Have discussed with the patient and available family all diagnostics and treatments performed thus far and all questions were answered to the best of my ability. The patient demonstrates understanding and agreement with plan.       The patient was evaluated in Emergency Department today for the symptoms described in the history of present illness. He/she was evaluated in the context of the global COVID-19 pandemic, which necessitated consideration that the patient might be at risk for infection with the SARS-CoV-2 virus that causes COVID-19. Institutional protocols and algorithms that pertain to the evaluation of patients at risk for COVID-19 are in a state of rapid change based on information released by regulatory bodies including the CDC and federal and state organizations. These policies and algorithms were followed  during the patient's care in the ED.  As part of my medical decision making, I reviewed the following data within the electronic MEDICAL RECORD NUMBER Nursing notes reviewed and incorporated, Labs reviewed, notes from prior ED visits and [place] Controlled Substance Database   ____________________________________________   FINAL CLINICAL IMPRESSION(S) / ED DIAGNOSES  Final diagnoses:  Chest pain, unspecified type      NEW MEDICATIONS STARTED DURING THIS VISIT:  New Prescriptions   No medications on file     Note:  This document was prepared using Dragon voice recognition software and may include unintentional dictation errors.    Willy Eddy, MD 08/15/19 [phone]

## 2019-08-15 NOTE — ED Notes (Signed)
Pt c/o right sided chest pain when he moves or takes a deep breath. Pt denies pain any other time. Pt states that he was lifting something heavy about 3 days ago when the pain started. Pt denies cardiac history. Does have history of high blood pressure

## 2019-08-15 NOTE — ED Triage Notes (Signed)
Pt reports pain to his right chest when he takes a deep breath for the last 3 days. Pt denies cough, congestion, recent injuries or other concerns.

## 2019-10-13 ENCOUNTER — Other Ambulatory Visit: Payer: Self-pay

## 2019-10-13 ENCOUNTER — Encounter: Payer: Self-pay | Admitting: Emergency Medicine

## 2019-10-13 ENCOUNTER — Emergency Department
Admission: EM | Admit: 2019-10-13 | Discharge: 2019-10-13 | Disposition: A | Discharge status: Home / Self Care | Payer: Self-pay | Attending: Emergency Medicine | Admitting: Emergency Medicine

## 2019-10-13 DIAGNOSIS — R0981 Nasal congestion: Secondary | ICD-10-CM | POA: Insufficient documentation

## 2019-10-13 DIAGNOSIS — F17200 Nicotine dependence, unspecified, uncomplicated: Secondary | ICD-10-CM | POA: Insufficient documentation

## 2019-10-13 MED ORDER — FLUTICASONE PROPIONATE 50 MCG/ACT NA SUSP: 2.0000 | Freq: Every day | NASAL | 0 refills | Status: AC

## 2019-10-13 NOTE — ED Provider Notes (Signed)
[facility] Emergency Department Provider Note   ____________________________________________   First MD Initiated Contact with Patient 10/13/19 1647     (approximate)  I have reviewed the triage vital signs and the nursing notes.   HISTORY  Chief Complaint Nasal Congestion   HPI Male David Giles is a 34 y.o. male presents to the ED with complaint of right-sided nasal congestion that has been clogged 2 days.  Patient states he was using cocaine when he felt that his nose was clogged up.  He denies any bleeding, coughing, fever, chills or Covid-like symptoms with this.  Patient states that he tried to use saline nose spray and had some improvement of his congestion before it closed up again.       History reviewed. No pertinent past medical history.  There are no problems to display for this patient.   History reviewed. No pertinent surgical history.  Prior to Admission medications   Medication Sig Start Date End Date Taking? Authorizing Provider  fluticasone (FLONASE) 50 MCG/ACT nasal spray Place 2 sprays into both nostrils daily. 10/13/19 10/12/20  Tommi Rumps, PA-C    Allergies Patient has no known allergies.  No family history on file.  Social History Social History   Tobacco Use  . Smoking status: Current Every Day Smoker  . Smokeless tobacco: Never Used  Substance Use Topics  . Alcohol use: Yes  . Drug use: Yes    Types: Marijuana, Cocaine    Review of Systems Constitutional: No fever/chills Eyes: No visual changes. ENT: Right sided nasal congestion. Cardiovascular: Denies chest pain. Respiratory: Denies shortness of breath. Genitourinary: Negative for dysuria. Musculoskeletal: Negative for back pain. Skin: Negative for rash. Neurological: Negative for headaches, focal weakness or numbness.   ____________________________________________   PHYSICAL EXAM:  VITAL SIGNS: ED Triage Vitals  Enc Vitals Group     BP  10/13/19 1623 (!) 165/89     Pulse Rate 10/13/19 1623 69     Resp 10/13/19 1623 16     Temp 10/13/19 1623 98.6 F (37 C)     Temp Source 10/13/19 1623 Oral     SpO2 10/13/19 1623 100 %     Weight 10/13/19 1624 265 lb (120.2 kg)     Height 10/13/19 1624 5\' 11"  (1.803 m)     Head Circumference --      Peak Flow --      Pain Score 10/13/19 1635 0     Pain Loc --      Pain Edu? --      Excl. in GC? --     Constitutional: Alert and oriented. Well appearing and in no acute distress. Eyes: Conjunctivae are normal. PERRL. EOMI. Head: Atraumatic. Nose: No rhinnorhea or bleeding.  Turbinates bilaterally are edematous with the right side being slightly larger than the left.  No mucus, bleeding or injury to the septum is noted. Mouth/Throat: Mucous membranes are moist.  Oropharynx non-erythematous. Neck: No stridor.   Cardiovascular: Normal rate, regular rhythm. Grossly normal heart sounds.  Good peripheral circulation. Respiratory: Normal respiratory effort.  No retractions. Lungs CTAB. Musculoskeletal: Normal gait was noted. Neurologic:  Normal speech and language. No gross focal neurologic deficits are appreciated. No gait instability. Skin:  Skin is warm, dry and intact. No rash noted. Psychiatric: Mood and affect are normal. Speech and behavior are normal.  ____________________________________________   LABS (all labs ordered are listed, but only abnormal results are displayed)  Labs Reviewed - No data to display  PROCEDURES  Procedure(s) performed (including Critical Care):  Procedures   ____________________________________________   INITIAL IMPRESSION / ASSESSMENT AND PLAN / ED COURSE  As part of my medical decision making, I reviewed the following data within the electronic MEDICAL RECORD NUMBER Notes from prior ED visits and [place] Controlled Substance Database    Qadir Bailon was evaluated in Emergency Department on 10/13/2019 for the symptoms described in the history of  present illness. He was evaluated in the context of the global COVID-19 pandemic, which necessitated consideration that the patient might be at risk for infection with the SARS-CoV-2 virus that causes COVID-19. Institutional protocols and algorithms that pertain to the evaluation of patients at risk for COVID-19 are in a state of rapid change based on information released by regulatory bodies including the CDC and federal and state organizations. These policies and algorithms were followed during the patient's care in the ED.  34 year old male presents to the ED with complaint of right-sided nasal congestion for the last 2 days.  Patient used cocaine prior to feeling that his nose was stopped up.  Patient is encouraged to discontinue use of cocaine at this time.  He was given a prescription for Flonase nasal spray to help reduce the swelling in his nose.  He is to follow-up with Dr. Lyn Hollingshead who is on-call for [facility] if any continued problems. ____________________________________________   FINAL CLINICAL IMPRESSION(S) / ED DIAGNOSES  Final diagnoses:  Nasal congestion     ED Discharge Orders         Ordered    fluticasone (FLONASE) 50 MCG/ACT nasal spray  Daily        10/13/19 1721           Note:  This document was prepared using Dragon voice recognition software and may include unintentional dictation errors.    Tommi Rumps, PA-C 10/13/19 1733    Gilles Chiquito, MD 10/13/19 [phone]

## 2019-10-13 NOTE — Discharge Instructions (Signed)
Discontinue using cocaine until your nose has improved.  Begin using Flonase nasal spray to each side of your nose once a day as directed.  If not improving in 1 week you should follow up with a ear nose and throat specialist.  The doctor on call for [facility] is Dr. Lyn Hollingshead and his contact information is listed on your discharge papers.

## 2019-10-13 NOTE — ED Notes (Signed)
See triage note  Presents with "feeling like his right side of his nose is clogged" admitted to doing cocaine

## 2019-10-13 NOTE — ED Triage Notes (Signed)
C/O right side of nose being clogged since Friday.  States was using cocaine and then felt nose was clogged.

## 2019-10-16 ENCOUNTER — Encounter: Payer: Self-pay | Admitting: Emergency Medicine

## 2019-10-16 ENCOUNTER — Other Ambulatory Visit: Payer: Self-pay

## 2019-10-16 ENCOUNTER — Emergency Department: Payer: Self-pay

## 2019-10-16 ENCOUNTER — Emergency Department
Admission: EM | Admit: 2019-10-16 | Discharge: 2019-10-16 | Disposition: A | Discharge status: Home / Self Care | Payer: Self-pay | Attending: Emergency Medicine | Admitting: Emergency Medicine

## 2019-10-16 DIAGNOSIS — Y9289 Other specified places as the place of occurrence of the external cause: Secondary | ICD-10-CM | POA: Insufficient documentation

## 2019-10-16 DIAGNOSIS — Y9389 Activity, other specified: Secondary | ICD-10-CM | POA: Insufficient documentation

## 2019-10-16 DIAGNOSIS — Y939 Activity, unspecified: Secondary | ICD-10-CM | POA: Insufficient documentation

## 2019-10-16 DIAGNOSIS — Y999 Unspecified external cause status: Secondary | ICD-10-CM | POA: Insufficient documentation

## 2019-10-16 DIAGNOSIS — F17200 Nicotine dependence, unspecified, uncomplicated: Secondary | ICD-10-CM | POA: Insufficient documentation

## 2019-10-16 DIAGNOSIS — X501XXA Overexertion from prolonged static or awkward postures, initial encounter: Secondary | ICD-10-CM | POA: Insufficient documentation

## 2019-10-16 DIAGNOSIS — S92301A Fracture of unspecified metatarsal bone(s), right foot, initial encounter for closed fracture: Secondary | ICD-10-CM | POA: Insufficient documentation

## 2019-10-16 MED ORDER — BACITRACIN-NEOMYCIN-POLYMYXIN 400-5-5000 EX OINT
TOPICAL_OINTMENT | Freq: Once | CUTANEOUS | Status: AC
  Filled 2019-10-16: qty 1

## 2019-10-16 MED ORDER — KETOROLAC TROMETHAMINE 10 MG PO TABS: 10.0000 mg | ORAL_TABLET | Freq: Four times a day (QID) | ORAL | 0 refills | Status: DC | PRN

## 2019-10-16 MED ORDER — KETOROLAC TROMETHAMINE 30 MG/ML IJ SOLN
30.0000 mg | Freq: Once | INTRAMUSCULAR | Status: AC
  Administered 2019-10-16: 30 mg via INTRAMUSCULAR
  Filled 2019-10-16: qty 1

## 2019-10-16 NOTE — ED Notes (Signed)
See triage note  Presents with pain to right foot  States running from a dog   Thinks he twisted his foot  Having pain with some swelling across toes  Small abrasion noted

## 2019-10-16 NOTE — Discharge Instructions (Signed)
You have broken your 3rd, 4th, and 5th metatarsals in your foot. Please wear boot and use crutches. Please call podiatry today for a follow up appointment.

## 2019-10-16 NOTE — ED Provider Notes (Signed)
[facility] Emergency Department Provider Note  ____________________________________________  Time seen: Approximately 9:54 AM  I have reviewed the triage vital signs and the nursing notes.   HISTORY  Chief Complaint Foot Pain    HPI David Giles is a 34 y.o. male that presents to the emergency department for evaluation of left foot pain after injury yesterday.  Patient was running from a dog and bent his foot backwards.  He has had continued pain and difficulty walking on foot since injury.  No additional injuries.   History reviewed. No pertinent past medical history.  There are no problems to display for this patient.   History reviewed. No pertinent surgical history.  Prior to Admission medications   Medication Sig Start Date End Date Taking? Authorizing Provider  fluticasone (FLONASE) 50 MCG/ACT nasal spray Place 2 sprays into both nostrils daily. 10/13/19 10/12/20  Tommi Rumps, PA-C  ketorolac (TORADOL) 10 MG tablet Take 1 tablet (10 mg total) by mouth every 6 (six) hours as needed. 10/16/19   Enid Derry, PA-C    Allergies Patient has no known allergies.  No family history on file.  Social History Social History   Tobacco Use  . Smoking status: Current Every Day Smoker  . Smokeless tobacco: Never Used  Substance Use Topics  . Alcohol use: Yes  . Drug use: Yes    Types: Marijuana, Cocaine     Review of Systems  Cardiovascular: No chest pain. Respiratory: No SOB. Gastrointestinal: No abdominal pain.  No nausea, no vomiting.  Musculoskeletal: Positive for foot pain. Skin: Negative for rash, abrasions, lacerations, ecchymosis. Neurological: Negative for numbness or tingling   ____________________________________________   PHYSICAL EXAM:  VITAL SIGNS: ED Triage Vitals  Enc Vitals Group     BP 10/16/19 0856 (!) 146/89     Pulse Rate 10/16/19 0856 70     Resp 10/16/19 0856 17     Temp 10/16/19 0856 98.2 F (36.8 C)      Temp Source 10/16/19 0856 Oral     SpO2 10/16/19 0856 100 %     Weight 10/16/19 0850 260 lb (117.9 kg)     Height 10/16/19 0850 5\' 10"  (1.778 m)     Head Circumference --      Peak Flow --      Pain Score 10/16/19 0850 8     Pain Loc --      Pain Edu? --      Excl. in GC? --      Constitutional: Alert and oriented. Well appearing and in no acute distress. Eyes: Conjunctivae are normal. PERRL. EOMI. Head: Atraumatic. ENT:      Ears:      Nose: No congestion/rhinnorhea.      Mouth/Throat: Mucous membranes are moist.  Neck: No stridor.  Cardiovascular: Normal rate, regular rhythm.  Good peripheral circulation. Respiratory: Normal respiratory effort without tachypnea or retractions. Lungs CTAB. Good air entry to the bases with no decreased or absent breath sounds. Musculoskeletal: Full range of motion to all extremities. No gross deformities appreciated.  Mild swelling to left foot with tenderness to palpation to midfoot and lateral foot. Neurologic:  Normal speech and language. No gross focal neurologic deficits are appreciated.  Skin:  Skin is warm, dry and intact. No rash noted. Psychiatric: Mood and affect are normal. Speech and behavior are normal. Patient exhibits appropriate insight and judgement.   ____________________________________________   LABS (all labs ordered are listed, but only abnormal results are displayed)  Labs  Reviewed - No data to display ____________________________________________  EKG   ____________________________________________  RADIOLOGY Lexine Baton, personally viewed and evaluated these images (plain radiographs) as part of my medical decision making, as well as reviewing the written report by the radiologist.  DG Foot Complete Left  Result Date: 10/16/2019 CLINICAL DATA:  Pain after twisting injury EXAM: LEFT FOOT - COMPLETE 3+ VIEW COMPARISON:  None. FINDINGS: Frontal, oblique, and lateral views obtained. No fracture or dislocation.  Joint spaces appear normal. No erosive change. There are small posterior and inferior calcaneal spurs. IMPRESSION: Calcaneal spurs. No appreciable joint space narrowing. No fracture or dislocation. Electronically Signed   By: Bretta Bang III M.D.   On: 10/16/2019 11:38   DG Foot Complete Right  Result Date: 10/16/2019 CLINICAL DATA:  Twisting injury while running, initial encounter EXAM: RIGHT FOOT COMPLETE - 3+ VIEW COMPARISON:  None. FINDINGS: Undisplaced fractures involving the distal aspects of the third, fourth and fifth metatarsals are seen. The fifth metatarsal fracture is subtle. Associated soft tissue swelling is noted. No other focal abnormality is seen. IMPRESSION: Distal third through fifth metatarsal fractures as described. Electronically Signed   By: Alcide Clever M.D.   On: 10/16/2019 09:51    ____________________________________________    PROCEDURES  Procedure(s) performed:    Procedures    Medications  neomycin-bacitracin-polymyxin (NEOSPORIN) ointment packet ( Topical Given 10/16/19 1108)  ketorolac (TORADOL) 30 MG/ML injection 30 mg (30 mg Intramuscular Given 10/16/19 1108)     ____________________________________________   INITIAL IMPRESSION / ASSESSMENT AND PLAN / ED COURSE  Pertinent labs & imaging results that were available during my care of the patient were reviewed by me and considered in my medical decision making (see chart for details).  Review of the [place] CSRS was performed in accordance of the [facility] prior to dispensing any controlled drugs.   Patient presented to the emergency department for evaluation of foot injury.  Vital signs and exam are reassuring.  Foot x-ray is remarkable for a distal third, fourth, fifth nondisplaced metatarsal fracture.  Cam boot was placed.  Crutches were given.  Patient will be discharged home with prescriptions for Toradol. Patient is to follow up with podiatry as directed. Patient is given ED precautions to return to  the ED for any worsening or new symptoms.   David Giles was evaluated in Emergency Department on 10/16/2019 for the symptoms described in the history of present illness. He was evaluated in the context of the global COVID-19 pandemic, which necessitated consideration that the patient might be at risk for infection with the SARS-CoV-2 virus that causes COVID-19. Institutional protocols and algorithms that pertain to the evaluation of patients at risk for COVID-19 are in a state of rapid change based on information released by regulatory bodies including the CDC and federal and state organizations. These policies and algorithms were followed during the patient's care in the ED.  ____________________________________________  FINAL CLINICAL IMPRESSION(S) / ED DIAGNOSES  Final diagnoses:  Closed nondisplaced fracture of metatarsal bone of right foot, unspecified metatarsal, initial encounter      NEW MEDICATIONS STARTED DURING THIS VISIT:  ED Discharge Orders         Ordered    ketorolac (TORADOL) 10 MG tablet  Every 6 hours PRN        10/16/19 1148              This chart was dictated using voice recognition software/Dragon. Despite best efforts to proofread, errors can occur which can change  the meaning. Any change was purely unintentional.    Enid Derry, PA-C 10/16/19 1526    Gilles Chiquito, MD 10/16/19 1530

## 2019-10-16 NOTE — ED Triage Notes (Addendum)
Pt brought over from [facility] with report that pt presented there and needed to be seen for an emergent situation so was advised to bring him to the ED.  Pt reports was running from a dog last pm and bent his foot and now with continued pain to his foot and numbness in his toes.

## 2019-10-20 ENCOUNTER — Emergency Department: Admission: EM | Admit: 2019-10-20 | Discharge: 2019-10-20 | Discharge status: Absent without leave | Payer: Self-pay

## 2020-02-08 ENCOUNTER — Encounter: Payer: Self-pay | Admitting: Emergency Medicine

## 2020-02-08 ENCOUNTER — Other Ambulatory Visit: Payer: Self-pay

## 2020-02-08 ENCOUNTER — Emergency Department
Admission: EM | Admit: 2020-02-08 | Discharge: 2020-02-08 | Disposition: A | Discharge status: Home / Self Care | Payer: Self-pay | Attending: Physician Assistant | Admitting: Physician Assistant

## 2020-02-08 DIAGNOSIS — R059 Cough, unspecified: Secondary | ICD-10-CM | POA: Insufficient documentation

## 2020-02-08 DIAGNOSIS — Z5321 Procedure and treatment not carried out due to patient leaving prior to being seen by health care provider: Secondary | ICD-10-CM | POA: Insufficient documentation

## 2020-02-08 NOTE — ED Notes (Signed)
Pt caled x's 3, no response

## 2020-02-08 NOTE — ED Triage Notes (Signed)
Pt reports a cough for 2 days and was exposed to covid

## 2020-02-08 NOTE — ED Triage Notes (Signed)
Pt called x's 2 from [facility] to treatment room, no reponse

## 2020-02-08 NOTE — ED Triage Notes (Signed)
Pt called from [facility] to treatment room, no response 

## 2020-02-09 ENCOUNTER — Emergency Department
Admission: EM | Admit: 2020-02-09 | Discharge: 2020-02-09 | Disposition: A | Discharge status: Home / Self Care | Payer: HRSA Program | Attending: Student in an Organized Health Care Education/Training Program | Admitting: Student in an Organized Health Care Education/Training Program

## 2020-02-09 ENCOUNTER — Other Ambulatory Visit: Payer: Self-pay

## 2020-02-09 ENCOUNTER — Encounter: Payer: Self-pay | Admitting: Emergency Medicine

## 2020-02-09 DIAGNOSIS — F17200 Nicotine dependence, unspecified, uncomplicated: Secondary | ICD-10-CM | POA: Diagnosis not present

## 2020-02-09 DIAGNOSIS — R030 Elevated blood-pressure reading, without diagnosis of hypertension: Secondary | ICD-10-CM | POA: Insufficient documentation

## 2020-02-09 DIAGNOSIS — U071 COVID-19: Secondary | ICD-10-CM | POA: Diagnosis not present

## 2020-02-09 DIAGNOSIS — Z20822 Contact with and (suspected) exposure to covid-19: Secondary | ICD-10-CM

## 2020-02-09 DIAGNOSIS — R059 Cough, unspecified: Secondary | ICD-10-CM | POA: Diagnosis present

## 2020-02-09 NOTE — ED Provider Notes (Signed)
[facility] Emergency Department Provider Note  ____________________________________________  Time seen: Approximately 11:48 AM  I have reviewed the triage vital signs and the nursing notes.   HISTORY  Chief Complaint Nasal Congestion and Cough    HPI David Giles is a 35 y.o. male that presents to the emergency department for evaluation of headache, nasal congestion, sore throat, nonproductive cough, fatigue for 2 days.  Patient received the Moderna vaccinations in October.  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  He works closely with them.  He presents today for a Covid test.  No shortness of breath, chest pain, vomiting, diarrhea.  History reviewed. No pertinent past medical history.  There are no problems to display for this patient.   History reviewed. No pertinent surgical history.  Prior to Admission medications   Medication Sig Start Date End Date Taking? Authorizing Provider  fluticasone (FLONASE) 50 MCG/ACT nasal spray Place 2 sprays into both nostrils daily. 10/13/19 10/12/20  Tommi Rumps, PA-C  ketorolac (TORADOL) 10 MG tablet Take 1 tablet (10 mg total) by mouth every 6 (six) hours as needed. 10/16/19   Enid Derry, PA-C    Allergies Patient has no known allergies.  No family history on file.  Social History Social History   Tobacco Use  . Smoking status: Current Every Day Smoker  . Smokeless tobacco: Never Used  Substance Use Topics  . Alcohol use: Yes  . Drug use: Yes    Types: Marijuana, Cocaine     Review of Systems  Constitutional: No fever/chills Eyes: No visual changes. No discharge. ENT: Positive for congestion and rhinorrhea. Positive for sore throat. Cardiovascular: No chest pain. Respiratory: Positive for cough. No SOB. Gastrointestinal: No abdominal pain.  No nausea, no vomiting.  No diarrhea.  No constipation. Musculoskeletal: Negative for musculoskeletal  pain. Skin: Negative for rash, abrasions, lacerations, ecchymosis. Neurological: Positive for headache.  ____________________________________________   PHYSICAL EXAM:  VITAL SIGNS: ED Triage Vitals  Enc Vitals Group     BP 02/09/20 1047 (!) 194/108     Pulse Rate 02/09/20 1047 70     Resp 02/09/20 1047 17     Temp 02/09/20 1047 98.7 F (37.1 C)     Temp Source 02/09/20 1047 Oral     SpO2 02/09/20 1047 100 %     Weight 02/09/20 1047 253 lb (114.8 kg)     Height 02/09/20 1047 5\' 10"  (1.778 m)     Head Circumference --      Peak Flow --      Pain Score 02/09/20 1053 7     Pain Loc --      Pain David? --      Excl. in GC? --      Constitutional: Alert and oriented. Well appearing and in no acute distress. Eyes: Conjunctivae are normal. PERRL. EOMI. No discharge. Head: Atraumatic. ENT: No frontal and maxillary sinus tenderness.      Ears: Tympanic membranes pearly gray with good landmarks. No discharge.      Nose: Mild congestion/rhinnorhea.      Mouth/Throat: Mucous membranes are moist. Oropharynx non-erythematous. Tonsils not enlarged. No exudates. Uvula midline. Neck: No stridor.   Hematological/Lymphatic/Immunilogical: No cervical lymphadenopathy. Cardiovascular: Normal rate, regular rhythm.  Good peripheral circulation. Respiratory: Normal respiratory effort without tachypnea or retractions. Lungs CTAB. Good air entry to the bases with no decreased or absent breath sounds. Gastrointestinal: Bowel sounds 4 quadrants. Soft and nontender to palpation. No  guarding or rigidity. No palpable masses. No distention. Musculoskeletal: Full range of motion to all extremities. No gross deformities appreciated. Neurologic:  Normal speech and language. No gross focal neurologic deficits are appreciated.  Skin:  Skin is warm, dry and intact. No rash noted. Psychiatric: Mood and affect are normal. Speech and behavior are normal. Patient exhibits appropriate insight and  judgement.   ____________________________________________   LABS (all labs ordered are listed, but only abnormal results are displayed)  Labs Reviewed  SARS CORONAVIRUS 2 (TAT 6-24 HRS)   ____________________________________________  EKG   ____________________________________________  RADIOLOGY   No results found.  ____________________________________________    PROCEDURES  Procedure(s) performed:    Procedures    Medications - No data to display   ____________________________________________   INITIAL IMPRESSION / ASSESSMENT AND PLAN / ED COURSE  Pertinent labs & imaging results that were available during my care of the patient were reviewed by me and considered in my medical decision making (see chart for details).  Review of the [place] CSRS was performed in accordance of the [facility] prior to dispensing any controlled drugs.   Patient's diagnosis is consistent with suspected Covid 19. Vital signs and exam are reassuring. Covid test is in process Patient appears well and is staying well hydrated. Patient should alternate tylenol and ibuprofen for fever. Patient feels comfortable going home. Patient is to follow up with PCP as needed or otherwise directed. Patient is given ED precautions to return to the ED for any worsening or new symptoms.   David Giles was evaluated in Emergency Department on 02/09/2020 for the symptoms described in the history of present illness. He was evaluated in the context of the global COVID-19 pandemic, which necessitated consideration that the patient might be at risk for infection with the SARS-CoV-2 virus that causes COVID-19. Institutional protocols and algorithms that pertain to the evaluation of patients at risk for COVID-19 are in a state of rapid change based on information released by regulatory bodies including the CDC and federal and state organizations. These policies and algorithms were followed during the patient's care in the  ED.  ____________________________________________  FINAL CLINICAL IMPRESSION(S) / ED DIAGNOSES  Final diagnoses:  Elevated blood pressure reading  Exposure to COVID-19 virus      NEW MEDICATIONS STARTED DURING THIS VISIT:  ED Discharge Orders    None          This chart was dictated using voice recognition software/Dragon. Despite best efforts to proofread, errors can occur which can change the meaning. Any change was purely unintentional.    Enid Derry, PA-C 02/09/20 1831    Willy Eddy, MD 02/10/20 1350

## 2020-02-09 NOTE — Discharge Instructions (Signed)
You can check your Covid results online tonight or tomorrow in my chart.  Your blood pressure was elevated in the emergency department.  Please follow-up with primary care for recheck of your blood pressure when you are feeling better so that you do not have a heart attack or a stroke someday.

## 2020-02-09 NOTE — ED Triage Notes (Addendum)
Patient to ER for c/o runny nose and dry cough x2 days. +Sore throat. 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

## 2020-02-10 ENCOUNTER — Emergency Department
Admission: EM | Admit: 2020-02-10 | Discharge: 2020-02-10 | Disposition: A | Discharge status: Home / Self Care | Payer: HRSA Program | Attending: Physician Assistant | Admitting: Physician Assistant

## 2020-02-10 ENCOUNTER — Other Ambulatory Visit: Payer: Self-pay

## 2020-02-10 ENCOUNTER — Encounter: Payer: Self-pay | Admitting: Emergency Medicine

## 2020-02-10 DIAGNOSIS — F17200 Nicotine dependence, unspecified, uncomplicated: Secondary | ICD-10-CM | POA: Diagnosis not present

## 2020-02-10 DIAGNOSIS — R059 Cough, unspecified: Secondary | ICD-10-CM | POA: Diagnosis present

## 2020-02-10 DIAGNOSIS — U071 COVID-19: Secondary | ICD-10-CM | POA: Diagnosis not present

## 2020-02-10 LAB — SARS CORONAVIRUS 2 (TAT 6-24 HRS): SARS Coronavirus 2: POSITIVE — AB

## 2020-02-10 MED ORDER — PSEUDOEPH-BROMPHEN-DM 30-2-10 MG/5ML PO SYRP: 5.0000 mL | ORAL_SOLUTION | Freq: Four times a day (QID) | ORAL | 0 refills | Status: DC | PRN

## 2020-02-10 MED ORDER — IBUPROFEN 600 MG PO TABS: 600.0000 mg | ORAL_TABLET | Freq: Three times a day (TID) | ORAL | 0 refills | Status: DC | PRN

## 2020-02-10 NOTE — ED Notes (Signed)
Patient verbalizes understanding of discharge instructions. Opportunity for questioning and answers were provided. Armband removed by staff, pt discharged from ED. Ambulated out to lobby  

## 2020-02-10 NOTE — Discharge Instructions (Signed)
Advised to self quarantine for the next 5 days.  New CDC guidelines since you have been previously vaccinated for COVID-19.  Take medication as directed.

## 2020-02-10 NOTE — ED Provider Notes (Signed)
[facility] Emergency Department Provider Note   ____________________________________________   None    (approximate)  I have reviewed the triage vital signs and the nursing notes.   HISTORY  Chief Complaint Fever, Chills, and Emesis    HPI David Giles is a 35 y.o. male patient presents for cold-like symptoms.  States he had chills, cough, nausea, and one episode of vomiting.  Patient also complain of body aches and low back pain.  Patient state recent Covid exposure on 02/02/2020.  Patient states taking 2 Covid vaccine in October 2021.  Patient not taken the booster.  Patient not taken the flu shot.  Patient was seen this facility yesterday.  Patient had a Covid test but did not stay for results.  Patient test positive.            History reviewed. No pertinent past medical history.  There are no problems to display for this patient.   History reviewed. No pertinent surgical history.  Prior to Admission medications   Medication Sig Start Date End Date Taking? Authorizing Provider  brompheniramine-pseudoephedrine-DM 30-2-10 MG/5ML syrup Take 5 mLs by mouth 4 (four) times daily as needed. 02/10/20  Yes Joni Reining, PA-C  ibuprofen (ADVIL) 600 MG tablet Take 1 tablet (600 mg total) by mouth every 8 (eight) hours as needed. 02/10/20  Yes Joni Reining, PA-C  fluticasone (FLONASE) 50 MCG/ACT nasal spray Place 2 sprays into both nostrils daily. 10/13/19 10/12/20  Tommi Rumps, PA-C  ketorolac (TORADOL) 10 MG tablet Take 1 tablet (10 mg total) by mouth every 6 (six) hours as needed. 10/16/19   Enid Derry, PA-C    Allergies Patient has no known allergies.  No family history on file.  Social History Social History   Tobacco Use  . Smoking status: Current Every Day Smoker  . Smokeless tobacco: Never Used  Substance Use Topics  . Alcohol use: Yes  . Drug use: Yes    Types: Marijuana, Cocaine    Review of Systems Constitutional:  Fever/chills and body aches.  Eyes: No visual changes. ENT: No sore throat.  Nasal congestion. Cardiovascular: Denies chest pain. Respiratory: Denies shortness of breath.  Nonproductive cough. Gastrointestinal: No abdominal pain.  Nausea with one episode of vomiting yesterday.  No diarrhea.  No constipation. Genitourinary: Negative for dysuria. Musculoskeletal: Positive for back pain. Skin: Negative for rash. Neurological: Positive for headaches focal weakness or numbness. Endocrine:  Hypertension   ____________________________________________   PHYSICAL EXAM:  VITAL SIGNS: ED Triage Vitals  Enc Vitals Group     BP 02/10/20 0707 (!) 171/105     Pulse Rate 02/10/20 0707 (!) 103     Resp 02/10/20 0707 18     Temp 02/10/20 0707 99.6 F (37.6 C)     Temp Source 02/10/20 0707 Oral     SpO2 02/10/20 0707 99 %     Weight 02/10/20 0710 253 lb (114.8 kg)     Height 02/10/20 0710 5\' 10"  (1.778 m)     Head Circumference --      Peak Flow --      Pain Score 02/10/20 0721 5     Pain Loc --      Pain Edu? --      Excl. in GC? --     Constitutional: Alert and oriented. Well appearing and in no acute distress. Eyes: Conjunctivae are normal. PERRL. EOMI. Head: Atraumatic. Nose: Edematous nasal turbinates.. Mouth/Throat: Mucous membranes are moist.  Oropharynx non-erythematous.  Postnasal  drainage. Neck: No stridor. Hematological/Lymphatic/Immunilogical: No cervical lymphadenopathy. Cardiovascular: Normal rate, regular rhythm. Grossly normal heart sounds.  Good peripheral circulation.  Abated blood pressure. Respiratory: Normal respiratory effort.  No retractions. Lungs CTAB. Gastrointestinal: Soft and nontender. No distention. No abdominal bruits. No CVA tenderness. Musculoskeletal: No lower extremity tenderness nor edema.  No joint effusions. Neurologic:  Normal speech and language. No gross focal neurologic deficits are appreciated. No gait instability. Skin:  Skin is warm, dry and  intact. No rash noted. Psychiatric: Mood and affect are normal. Speech and behavior are normal.  ____________________________________________   LABS (all labs ordered are listed, but only abnormal results are displayed)  Labs Reviewed - No data to display ____________________________________________  EKG   ____________________________________________  RADIOLOGY I, Joni Reining, personally viewed and evaluated these images (plain radiographs) as part of my medical decision making, as well as reviewing the written report by the radiologist.  ED MD interpretation:    Official radiology report(s): No results found.  ____________________________________________   PROCEDURES  Procedure(s) performed (including Critical Care): Procedures   ____________________________________________   INITIAL IMPRESSION / ASSESSMENT AND PLAN / ED COURSE  As part of my medical decision making, I reviewed the following data within the electronic MEDICAL RECORD NUMBER         Patient presents with fever/chills, body aches, nasal congestion, and cough.  Patient test positive for COVID-19.  Patient given discharge care instruction advised self quarantine per CDC recommendations.  Take medication as directed.  Advised to consider boost after quarantine.      ____________________________________________   FINAL CLINICAL IMPRESSION(S) / ED DIAGNOSES  Final diagnoses:  COVID-19     ED Discharge Orders         Ordered    brompheniramine-pseudoephedrine-DM 30-2-10 MG/5ML syrup  4 times daily PRN        02/10/20 0758    ibuprofen (ADVIL) 600 MG tablet  Every 8 hours PRN        02/10/20 0758          *Please note:  David Giles was evaluated in Emergency Department on 02/10/2020 for the symptoms described in the history of present illness. He was evaluated in the context of the global COVID-19 pandemic, which necessitated consideration that the patient might be at risk for infection with  the SARS-CoV-2 virus that causes COVID-19. Institutional protocols and algorithms that pertain to the evaluation of patients at risk for COVID-19 are in a state of rapid change based on information released by regulatory bodies including the CDC and federal and state organizations. These policies and algorithms were followed during the patient's care in the ED.  Some ED evaluations and interventions Giles be delayed as a result of limited staffing during and the pandemic.*   Note:  This document was prepared using Dragon voice recognition software and Giles include unintentional dictation errors.    Joni Reining, PA-C 02/10/20 9678    Shaune Pollack, MD 02/11/20 [phone]

## 2020-02-10 NOTE — ED Notes (Signed)
Covid+ result in system as of 1/3

## 2020-02-10 NOTE — ED Triage Notes (Signed)
Pt seen yesterday for cold-like symptoms. Presents today w/chills, cough, emesis, and recent +covid exposure on 12/27. Denies any sob, sats 99% in triage. Also c/o low back pain

## 2020-02-10 NOTE — ED Notes (Signed)
PA assessed pt in triage

## 2020-04-26 NOTE — Progress Notes (Signed)
 [place] Department of Health notified of results per regulations

## 2020-04-26 NOTE — Progress Notes (Signed)
 Patient was reached and results were delivered.

## 2020-07-30 ENCOUNTER — Other Ambulatory Visit: Payer: Self-pay

## 2020-07-30 ENCOUNTER — Emergency Department
Admission: EM | Admit: 2020-07-30 | Discharge: 2020-07-30 | Disposition: A | Discharge status: Home / Self Care | Payer: Self-pay | Attending: Emergency Medicine | Admitting: Emergency Medicine

## 2020-07-30 DIAGNOSIS — K0889 Other specified disorders of teeth and supporting structures: Secondary | ICD-10-CM | POA: Insufficient documentation

## 2020-07-30 DIAGNOSIS — I10 Essential (primary) hypertension: Secondary | ICD-10-CM | POA: Insufficient documentation

## 2020-07-30 DIAGNOSIS — F17200 Nicotine dependence, unspecified, uncomplicated: Secondary | ICD-10-CM | POA: Insufficient documentation

## 2020-07-30 HISTORY — DX: Essential (primary) hypertension: I10

## 2020-07-30 MED ORDER — IBUPROFEN 600 MG PO TABS: 600.0000 mg | ORAL_TABLET | Freq: Three times a day (TID) | ORAL | 0 refills | Status: DC | PRN

## 2020-07-30 MED ORDER — AMOXICILLIN 500 MG PO CAPS: 500.0000 mg | ORAL_CAPSULE | Freq: Three times a day (TID) | ORAL | 0 refills | Status: AC

## 2020-07-30 MED ORDER — OXYCODONE-ACETAMINOPHEN 5-325 MG PO TABS
2.0000 | ORAL_TABLET | Freq: Once | ORAL | Status: AC
  Administered 2020-07-30: 2 via ORAL
  Filled 2020-07-30: qty 2

## 2020-07-30 MED ORDER — BUPIVACAINE HCL (PF) 0.5 % IJ SOLN
10.0000 mL | Freq: Once | INTRAMUSCULAR | Status: AC
  Administered 2020-07-30: 10 mL
  Filled 2020-07-30: qty 10

## 2020-07-30 NOTE — ED Notes (Signed)
Pt with #2 molar bottom left broken. Pain 8/10

## 2020-07-30 NOTE — ED Provider Notes (Signed)
[facility] Emergency Department Provider Note  ____________________________________________   Event Date/Time   First MD Initiated Contact with Patient 07/30/20 1715     (approximate)  I have reviewed the triage vital signs and the nursing notes.   HISTORY  Chief Complaint Dental Pain    HPI David Giles is a 35 y.o. male here with dental pain.  The patient states that several months ago, he believes the left lower premolar tooth broke off when he was eating something.  He reports immediate onset of sharp, stabbing pain.  He states that since then, he has had some intermittent pain.  Over the last several days, he has noticed increasing swelling and pain to the area.  He states he was eating a fry yesterday, and felt like something popped.  He felt and tasted a foul-tasting substance.  Since then, he has had ongoing, aching, throbbing, 8 out of 10 pain.  He is been taking over-the-counter medicine without relief.  No fevers.  No tongue swelling.  No difficulty speaking or swallowing.  He has tried to get in with a dentist but has not been able to get in recently.    Past Medical History:  Diagnosis Date   Hypertension     There are no problems to display for this patient.   History reviewed. No pertinent surgical history.  Prior to Admission medications   Medication Sig Start Date End Date Taking? Authorizing Provider  amoxicillin (AMOXIL) 500 MG capsule Take 1 capsule (500 mg total) by mouth 3 (three) times daily for 7 days. 07/30/20 08/06/20 Yes Shaune Pollack, MD  ibuprofen (ADVIL) 600 MG tablet Take 1 tablet (600 mg total) by mouth every 8 (eight) hours as needed for moderate pain. 07/30/20  Yes Shaune Pollack, MD  brompheniramine-pseudoephedrine-DM 30-2-10 MG/5ML syrup Take 5 mLs by mouth 4 (four) times daily as needed. 02/10/20   Joni Reining, PA-C  fluticasone (FLONASE) 50 MCG/ACT nasal spray Place 2 sprays into both nostrils daily. 10/13/19  10/12/20  Tommi Rumps, PA-C  ketorolac (TORADOL) 10 MG tablet Take 1 tablet (10 mg total) by mouth every 6 (six) hours as needed. 10/16/19   Enid Derry, PA-C    Allergies Patient has no known allergies.  No family history on file.  Social History Social History   Tobacco Use   Smoking status: Every Day    Pack years: 0.00   Smokeless tobacco: Never  Substance Use Topics   Alcohol use: Yes   Drug use: Yes    Types: Marijuana, Cocaine    Review of Systems  Review of Systems  Constitutional:  Negative for chills, fatigue and fever.  HENT:  Positive for dental problem. Negative for sore throat.   Respiratory:  Negative for shortness of breath.   Cardiovascular:  Negative for chest pain.  Gastrointestinal:  Negative for abdominal pain.  Genitourinary:  Negative for flank pain.  Musculoskeletal:  Negative for neck pain.  Skin:  Negative for rash and wound.  Allergic/Immunologic: Negative for immunocompromised state.  Neurological:  Negative for weakness and numbness.  Hematological:  Does not bruise/bleed easily.  All other systems reviewed and are negative.   ____________________________________________  PHYSICAL EXAM:      VITAL SIGNS: ED Triage Vitals  Enc Vitals Group     BP 07/30/20 1640 (!) 172/124     Pulse Rate 07/30/20 1640 63     Resp 07/30/20 1640 18     Temp 07/30/20 1640 98.8 F (37.1 C)  Temp Source 07/30/20 1640 Oral     SpO2 07/30/20 1640 97 %     Weight 07/30/20 1641 265 lb (120.2 kg)     Height 07/30/20 1641 5\' 11"  (1.803 m)     Head Circumference --      Peak Flow --      Pain Score 07/30/20 1641 7     Pain Loc --      Pain Edu? --      Excl. in GC? --      Physical Exam Vitals and nursing note reviewed.  Constitutional:      General: He is not in acute distress.    Appearance: He is well-developed.  HENT:     Head: Normocephalic and atraumatic.     Comments: Fracture of the left lower premolar with exposed dental pulp.  There  is moderate surrounding gingival edema, and what appears to be a drained abscess along the medial aspect of the gingiva.  No sublingual swelling or induration.  Tongue protrusion is normal. Eyes:     Conjunctiva/sclera: Conjunctivae normal.  Cardiovascular:     Rate and Rhythm: Normal rate and regular rhythm.     Heart sounds: Normal heart sounds.  Pulmonary:     Effort: Pulmonary effort is normal. No respiratory distress.     Breath sounds: No wheezing.  Abdominal:     General: There is no distension.  Musculoskeletal:     Cervical back: Neck supple.  Skin:    General: Skin is warm.     Capillary Refill: Capillary refill takes less than 2 seconds.     Findings: No rash.  Neurological:     Mental Status: He is alert and oriented to person, place, and time.     Motor: No abnormal muscle tone.      ____________________________________________   LABS (all labs ordered are listed, but only abnormal results are displayed)  Labs Reviewed - No data to display  ____________________________________________  EKG:  ________________________________________  RADIOLOGY All imaging, including plain films, CT scans, and ultrasounds, independently reviewed by me, and interpretations confirmed via formal radiology reads.  ED MD interpretation:     Official radiology report(s): No results found.  ____________________________________________  PROCEDURES   Procedure(s) performed (including Critical Care):  Dental Block  Date/Time: 07/30/2020 6:33 PM Performed by: 08/01/2020, MD Authorized by: Shaune Pollack, MD   Consent:    Consent obtained:  Verbal   Consent given by:  Patient   Risks, benefits, and alternatives were discussed: yes     Risks discussed:  Allergic reaction, hematoma, infection, intravascular injection, nerve damage, pain, swelling and unsuccessful block   Alternatives discussed:  Alternative treatment Indications:    Indications: dental pain    Location:    Block type:  Inferior alveolar   Laterality:  Left Procedure details:    Syringe type:  Controlled syringe   Needle gauge:  27 G   Anesthetic injected:  Bupivacaine 0.5% w/o epi   Injection procedure:  Anatomic landmarks identified Post-procedure details:    Outcome:  Anesthesia achieved   Procedure completion:  Tolerated  ____________________________________________  INITIAL IMPRESSION / MDM / ASSESSMENT AND PLAN / ED COURSE  As part of my medical decision making, I reviewed the following data within the electronic MEDICAL RECORD NUMBER Nursing notes reviewed and incorporated, Old chart reviewed, Notes from prior ED visits, and [place] Controlled Substance Database       *David Giles was evaluated in Emergency Department on 07/30/2020 for the  symptoms described in the history of present illness. He was evaluated in the context of the global COVID-19 pandemic, which necessitated consideration that the patient might be at risk for infection with the SARS-CoV-2 virus that causes COVID-19. Institutional protocols and algorithms that pertain to the evaluation of patients at risk for COVID-19 are in a state of rapid change based on information released by regulatory bodies including the CDC and federal and state organizations. These policies and algorithms were followed during the patient's care in the ED.  Some ED evaluations and interventions may be delayed as a result of limited staffing during the pandemic.*     Medical Decision Making: 35 year old well-appearing male here with dental pain.  Exam is consistent with drained gingival abscess at focal site of dental caries.  He has a fractured tooth which appears to be the source.  No evidence of ongoing abscess.  He has no clinical signs of Ludwig's or deep neck infection.  He is afebrile and without signs of systemic illness.  He was significantly hypertensive here, though on repeat in the room it was improved.  I suspect this is due to  pain and he admits to a history of hypertension when he was in the ER.  He has no headache, vision changes, chest pain, shortness of breath, other signs to suggest symptomatic hypertension.  I discussed management options, and patient would like dental block.  Dental block performed with achievement of anesthesia.  Patient feels markedly improved.  No neurological deficits and patient tolerated well.  No bleeding.  Will discharge with outpatient antibiotics and dental f/u.  ____________________________________________  FINAL CLINICAL IMPRESSION(S) / ED DIAGNOSES  Final diagnoses:  Pain, dental     MEDICATIONS GIVEN DURING THIS VISIT:  Medications  bupivacaine (MARCAINE) 0.5 % injection 10 mL (10 mLs Infiltration Given 07/30/20 1803)  oxyCODONE-acetaminophen (PERCOCET/ROXICET) 5-325 MG per tablet 2 tablet (2 tablets Oral Given 07/30/20 1803)     ED Discharge Orders          Ordered    amoxicillin (AMOXIL) 500 MG capsule  3 times daily        07/30/20 1818    ibuprofen (ADVIL) 600 MG tablet  Every 8 hours PRN        07/30/20 1818             Note:  This document was prepared using Dragon voice recognition software and may include unintentional dictation errors.   Shaune Pollack, MD 07/30/20 1929

## 2020-07-30 NOTE — ED Triage Notes (Signed)
Pt states he has a "hole" in one of his back bottom teeth on the left side- pt states he had an abscess on iy but it popped when he was eating earlier and he is now having sharp pains in that tooth

## 2020-09-01 ENCOUNTER — Other Ambulatory Visit: Payer: Self-pay

## 2020-09-01 ENCOUNTER — Emergency Department
Admission: EM | Admit: 2020-09-01 | Discharge: 2020-09-01 | Disposition: A | Discharge status: Home / Self Care | Payer: Self-pay | Attending: Emergency Medicine | Admitting: Emergency Medicine

## 2020-09-01 DIAGNOSIS — Z20822 Contact with and (suspected) exposure to covid-19: Secondary | ICD-10-CM | POA: Insufficient documentation

## 2020-09-01 DIAGNOSIS — F17200 Nicotine dependence, unspecified, uncomplicated: Secondary | ICD-10-CM | POA: Insufficient documentation

## 2020-09-01 DIAGNOSIS — I10 Essential (primary) hypertension: Secondary | ICD-10-CM | POA: Insufficient documentation

## 2020-09-01 LAB — RESP PANEL BY RT-PCR (FLU A&B, COVID) ARPGX2
Influenza A by PCR: NEGATIVE
Influenza B by PCR: NEGATIVE
SARS Coronavirus 2 by RT PCR: NEGATIVE

## 2020-09-01 NOTE — ED Provider Notes (Signed)
[facility]-EMERGENCY DEPARTMENT  ____________________________________________  Time seen: Approximately 3:55 PM  I have reviewed the triage vital signs and the nursing notes.   HISTORY  Chief Complaint covid test   Historian Patient     HPI David Giles is a 35 y.o. male presents to the emergency department for COVID-19 test.  He is asymptomatic at this time.  Denies chest pain, chest tightness or abdominal pain.   Past Medical History:  Diagnosis Date   Hypertension      Immunizations up to date:  Yes.     Past Medical History:  Diagnosis Date   Hypertension     There are no problems to display for this patient.   History reviewed. No pertinent surgical history.  Prior to Admission medications   Medication Sig Start Date End Date Taking? Authorizing Provider  brompheniramine-pseudoephedrine-DM 30-2-10 MG/5ML syrup Take 5 mLs by mouth 4 (four) times daily as needed. 02/10/20   Joni Reining, PA-C  fluticasone (FLONASE) 50 MCG/ACT nasal spray Place 2 sprays into both nostrils daily. 10/13/19 10/12/20  Tommi Rumps, PA-C  ibuprofen (ADVIL) 600 MG tablet Take 1 tablet (600 mg total) by mouth every 8 (eight) hours as needed for moderate pain. 07/30/20   Shaune Pollack, MD  ketorolac (TORADOL) 10 MG tablet Take 1 tablet (10 mg total) by mouth every 6 (six) hours as needed. 10/16/19   Enid Derry, PA-C    Allergies Patient has no known allergies.  History reviewed. No pertinent family history.  Social History Social History   Tobacco Use   Smoking status: Every Day   Smokeless tobacco: Never  Substance Use Topics   Alcohol use: Yes   Drug use: Yes    Types: Marijuana, Cocaine     Review of Systems  Constitutional: No fever/chills Eyes:  No discharge ENT: No upper respiratory complaints. Respiratory: no cough. No SOB/ use of accessory muscles to breath Gastrointestinal:   No nausea, no vomiting.  No diarrhea.  No constipation. Musculoskeletal:  Negative for musculoskeletal pain. Skin: Negative for rash, abrasions, lacerations, ecchymosis.   ____________________________________________   PHYSICAL EXAM:  VITAL SIGNS: ED Triage Vitals  Enc Vitals Group     BP 09/01/20 1510 (!) 147/105     Pulse Rate 09/01/20 1510 73     Resp 09/01/20 1510 18     Temp 09/01/20 1510 98.4 F (36.9 C)     Temp Source 09/01/20 1510 Oral     SpO2 09/01/20 1510 97 %     Weight 09/01/20 1509 264 lb 8.8 oz (120 kg)     Height 09/01/20 1509 5\' 11"  (1.803 m)     Head Circumference --      Peak Flow --      Pain Score 09/01/20 1509 0     Pain Loc --      Pain Edu? --      Excl. in GC? --      Constitutional: Alert and oriented. Well appearing and in no acute distress. Eyes: Conjunctivae are normal. PERRL. EOMI. Head: Atraumatic. ENT:      Nose: No congestion/rhinnorhea.      Mouth/Throat: Mucous membranes are moist.  Neck: No stridor.  No cervical spine tenderness to palpation. Cardiovascular: Normal rate, regular rhythm. Normal S1 and S2.  Good peripheral circulation. Respiratory: Normal respiratory effort without tachypnea or retractions. Lungs CTAB. Good air entry to the bases with no decreased or absent breath sounds Gastrointestinal: Bowel sounds x 4 quadrants. Soft and nontender to palpation.  No guarding or rigidity. No distention. Musculoskeletal: Full range of motion to all extremities. No obvious deformities noted Neurologic:  Normal for age. No gross focal neurologic deficits are appreciated.  Skin:  Skin is warm, dry and intact. No rash noted. Psychiatric: Mood and affect are normal for age. Speech and behavior are normal.   ____________________________________________   LABS (all labs ordered are listed, but only abnormal results are displayed)  Labs Reviewed  RESP PANEL BY RT-PCR (FLU A&B, COVID) [id number]    ____________________________________________  EKG   ____________________________________________  RADIOLOGY   No results found.  ____________________________________________    PROCEDURES  Procedure(s) performed:     Procedures     Medications - No data to display   ____________________________________________   INITIAL IMPRESSION / ASSESSMENT AND PLAN / ED COURSE  Pertinent labs & imaging results that were available during my care of the patient were reviewed by me and considered in my medical decision making (see chart for details).      Assessment and plan Routine COVID-16 testing 35 year old male presents to the emergency department for COVID-19 test.  He is asymptomatic at this time.  COVID-19 testing was performed will post to patient's MyChart.  All patient questions were answered.     ____________________________________________  FINAL CLINICAL IMPRESSION(S) / ED DIAGNOSES  Final diagnoses:  Encounter for laboratory testing for COVID-19 virus      NEW MEDICATIONS STARTED DURING THIS VISIT:  ED Discharge Orders     None           This chart was dictated using voice recognition software/Dragon. Despite best efforts to proofread, errors can occur which can change the meaning. Any change was purely unintentional.     Orvil Feil, PA-C 09/01/20 1559    Merwyn Katos, MD 09/01/20 [phone]

## 2020-09-01 NOTE — Discharge Instructions (Addendum)
Your COVID-19 testing results will post to MyChart.

## 2020-09-01 NOTE — ED Triage Notes (Signed)
Pt wants covid test  °

## 2020-12-06 NOTE — Progress Notes (Signed)
 Subjective:  Patient ID: David Giles is a 35 y.o. male.  HPI  Pt presents today for covid testing. He states that he was recently exposed. He denies sx's        Review of Systems  Constitutional: Negative for appetite change, chills, fatigue and fever.  HENT: Negative for congestion, rhinorrhea, sinus pressure, sneezing, sore throat and trouble swallowing.   Respiratory: Negative for cough, chest tightness, shortness of breath and wheezing.   Cardiovascular: Negative for chest pain.  Gastrointestinal: Negative for abdominal pain, diarrhea, nausea and vomiting.  Musculoskeletal: Negative for myalgias, neck pain and neck stiffness.  Neurological: Negative for headaches.  Hematological: Negative for adenopathy.    Social History   Tobacco Use  Smoking Status Every Day  Smokeless Tobacco Never   History reviewed. No pertinent past medical history. History reviewed. No pertinent surgical history. History reviewed. No pertinent family history. Objective:      Physical Exam Vitals reviewed.  Constitutional:      General: He is not in acute distress.    Appearance: Normal appearance.  HENT:     Head: Normocephalic and atraumatic.  Cardiovascular:     Rate and Rhythm: Normal rate and regular rhythm.     Pulses: Normal pulses.     Heart sounds: Normal heart sounds. No murmur heard. Pulmonary:     Effort: Pulmonary effort is normal. No respiratory distress.     Breath sounds: Normal breath sounds. No stridor. No wheezing, rhonchi or rales.  Neurological:     General: No focal deficit present.     Mental Status: He is alert and oriented to person, place, and time.  Psychiatric:        Mood and Affect: Mood normal.        Behavior: Behavior normal.       Assessment/Plan:    Based on today's history, physical exam and review of all relevant testing completed in clinic today  patient's visit diagnosis is/includes  1. Contact with and (suspected) exposure to covid-19    2. Encounter for observation for suspected exposure to other biological agents ruled out    Patient does not have a history of chronic conditions identified today.  Treatment plan includes:  Orders Placed: Orders Placed This Encounter  Procedures  . LumiraDX SARS-COV-2 Rapid Result Antigen Test   Medications ordered this visit    No prescriptions requested or ordered in this encounter    Provider Recommendations    Follow up care instructions were provided to and reviewed with the Patient. All questions were answered and patient verbalized understanding of plan of care today.                                               [id number] Screening Questionnaire Completed State Department of Health notified of results per regulations

## 2021-02-20 ENCOUNTER — Emergency Department
Admission: EM | Admit: 2021-02-20 | Discharge: 2021-02-20 | Disposition: A | Discharge status: Home / Self Care | Payer: Self-pay | Attending: Emergency Medicine | Admitting: Emergency Medicine

## 2021-02-20 ENCOUNTER — Other Ambulatory Visit: Payer: Self-pay

## 2021-02-20 ENCOUNTER — Encounter: Payer: Self-pay | Admitting: Emergency Medicine

## 2021-02-20 DIAGNOSIS — K047 Periapical abscess without sinus: Secondary | ICD-10-CM | POA: Insufficient documentation

## 2021-02-20 DIAGNOSIS — R030 Elevated blood-pressure reading, without diagnosis of hypertension: Secondary | ICD-10-CM | POA: Insufficient documentation

## 2021-02-20 DIAGNOSIS — R070 Pain in throat: Secondary | ICD-10-CM | POA: Insufficient documentation

## 2021-02-20 MED ORDER — AMOXICILLIN 875 MG PO TABS: 875.0000 mg | ORAL_TABLET | Freq: Two times a day (BID) | ORAL | 0 refills | Status: DC

## 2021-02-20 MED ORDER — IBUPROFEN 600 MG PO TABS: 600.0000 mg | ORAL_TABLET | Freq: Four times a day (QID) | ORAL | 0 refills | Status: DC | PRN

## 2021-02-20 NOTE — ED Triage Notes (Signed)
Pt reports pain to back left gums above wisdom teeth and also sore throat and what feels like a knot

## 2021-02-20 NOTE — Discharge Instructions (Signed)
Begin taking amoxicillin twice a day for the next 10 days even if area begins to improve.  Ibuprofen 600 mg is with food every 6 hours as needed and you may also take Tylenol along with this medication if needed.  Your blood pressure was elevated today at 163/104 and needs to be rechecked.  A list of clinics is on your discharge papers so that you can obtain a primary care provider.  You will need to call these offices to see if they are taking new patients and make an appointment.  Also a list of dental clinics is listed on your discharge papers so that they can evaluate your gums and teeth For further treatment.    OPTIONS FOR DENTAL FOLLOW UP CARE  [place] [facility] - Local Safety Net Dental Clinics [email or web address]   [facility] [phone])  [facility] [phone])  [place] [phone])  [facility] [phone])  [facility] [phone]) This clinic caters to the indigent population and is on a lottery system. Location: [employer] [facility], [employer], [address] [place] Clinic Hours: Wednesdays from 6pm - 9pm, patients seen by a lottery system. For dates, call or go to [email or web address] Services: Cleanings, fillings and simple extractions. Payment Options: DENTAL WORK IS FREE OF CHARGE. Bring proof of income or support. Best way to get seen: Arrive at 5:15 pm - this is a lottery, NOT first come/first serve, so arriving earlier will not increase your chances of being seen.     [facility] [phone] Select option 1 for emergencies   Location: [facility], [place], [address], [place] Clinic Hours: No walk-ins accepted - call the day before to schedule an appointment. Check in times are 9:30 am and 1:30 pm. Services: Simple  extractions, temporary fillings, pulpectomy/pulp debridement, uncomplicated abscess drainage. Payment Options: PAYMENT IS DUE AT THE TIME OF SERVICE.  Fee is usually $100-200, additional surgical procedures (e.g. abscess drainage) may be extra. Cash, checks, Visa/MasterCard accepted.  Can file Medicaid if patient is covered for dental - patient should call case worker to check. No discount for [facility] patients. Best way to get seen: MUST call the day before and get onto the schedule. Can usually be seen the next 1-2 days. No walk-ins accepted.     [facility] [phone]   Location: [facility], [address], [place] Clinic Hours: M, W, Th, F 8am or 1:30pm, Tues 9a or 1:30 - first come/first served. Services: Simple extractions, temporary fillings, uncomplicated abscess drainage.  You do not need to be an [facility] resident. Payment Options: PAYMENT IS DUE AT THE TIME OF SERVICE. Dental insurance, otherwise sliding scale - bring proof of income or support. Depending on income and treatment needed, cost is usually $50-200. Best way to get seen: Arrive early as it is first come/first served.     [facility] [phone]   Location: [address] Clinic Hours: Mon-Thu 8a-5p Services: Most basic dental services including extractions and fillings. Payment Options: PAYMENT IS DUE AT THE TIME OF SERVICE. Sliding scale, up to 50% off - bring proof if income or support. Medicaid with dental option accepted. Best way to get seen: Call to schedule an appointment, can usually be seen within 2 weeks OR they will try to see walk-ins - show up at 8a or 2p (you may  have to wait).     [facility] [phone] [place] RESIDENTS ONLY   Location: [facility], [address] [place], [place] [address] Clinic Hours: By appointment only. Monday -  Thursday 8am-5pm, Friday 8am-12pm Services: Cleanings, fillings, extractions. Payment Options: PAYMENT IS DUE AT THE TIME OF SERVICE. Cash, Visa or MasterCard. Sliding scale - $30 minimum per service. Best way to get seen: Come in to office, complete packet and make an appointment - need proof of income or support monies for each household member and proof of [facility] residence. Usually takes about a month to get in.     [facility] [phone]   Location: [address] [facility] Clinic Hours: Walk-in Urgent Care Dental Services are offered Monday-Friday mornings only. The numbers of emergencies accepted daily is limited to the number of providers available. Maximum 15 - Mondays, Wednesdays & Thursdays Maximum 10 - Tuesdays & Fridays Services: You do not need to be a [facility] resident to be seen for a dental emergency. Emergencies are defined as pain, swelling, abnormal bleeding, or dental trauma. Walkins will receive x-rays if needed. NOTE: Dental cleaning is not an emergency. Payment Options: PAYMENT IS DUE AT THE TIME OF SERVICE. Minimum co-pay is $40.00 for uninsured patients. Minimum co-pay is $3.00 for Medicaid with dental coverage. Dental Insurance is accepted and must be presented at time of visit. Medicare does not cover dental. Forms of payment: Cash, credit card, checks. Best way to get seen: If not previously registered with the clinic, walk-in dental registration begins at 7:15 am and is on a first come/first serve basis. If previously registered with the clinic, call to make an appointment.     [facility] [phone] [place] RESIDENTS ONLY   Location: [address], [place], [place] Clinic Hours: Mon-Thu 10a-2p Services: Extractions only! Payment Options: FREE (donations accepted) - bring proof of income or support Best way to get seen: Call and schedule an appointment OR come at 8am on the  1st Monday of every month (except for holidays) when it is first come/first served.     [facility] [phone]   Location: [address] [place], [place] Clinic Hours: Friday mornings Services, Payment Options, Best way to get seen: Call for info

## 2021-02-20 NOTE — ED Provider Notes (Signed)
[facility] Provider Note    Event Date/Time   First MD Initiated Contact with Patient 02/20/21 1418     (approximate)   History   Sore Throat and Dental Pain   HPI  Javonni Baquero is a 36 y.o. male presents to the ED with complaint of left lower gum pain and swelling.  He states instead of sore throat he actually has tenderness on palpation of his neck adjacent to the tooth involved.  He denies any fever or chills.  He states that has been difficult to eat due to pain.  He rates pain as 7/10.     Physical Exam   Triage Vital Signs: ED Triage Vitals  Enc Vitals Group     BP 02/20/21 1244 (!) 163/104     Pulse Rate 02/20/21 1244 64     Resp 02/20/21 1244 16     Temp 02/20/21 1244 97.7 F (36.5 C)     Temp Source 02/20/21 1244 Oral     SpO2 02/20/21 1244 98 %     Weight 02/20/21 1241 260 lb (117.9 kg)     Height 02/20/21 1241 5\' 11"  (1.803 m)     Head Circumference --      Peak Flow --      Pain Score 02/20/21 1241 7     Pain Loc --      Pain Edu? --      Excl. in [place]? --     Most recent vital signs: Vitals:   02/20/21 1244 02/20/21 1508  BP: (!) 163/104 (!) 147/101  Pulse: 64   Resp: 16   Temp: 97.7 F (36.5 C)   SpO2: 98%      General: Awake, no distress.  CV:  Good peripheral perfusion.  Heart regular rate and rhythm without murmur. Resp:  Normal effort.  Lungs are clear bilaterally. Abd:  No distention.  Other:  On examination of the posterior molars left there is a partial avulsion/cary present with moderate gum edema and tenderness surrounding this area.  Left lymph nodes are minimally tender to palpation.  No gross deformity.  No facial edema present at this time.   ED Results / Procedures / Treatments   Labs (all labs ordered are listed, but only abnormal results are displayed) Labs Reviewed - No data to display   PROCEDURES:  Critical Care performed: No  Procedures   MEDICATIONS ORDERED IN ED: Medications - No  data to display   IMPRESSION / MDM / [place] / ED COURSE  I reviewed the triage vital signs and the nursing notes.                              Differential diagnosis includes, but is not limited to, dental abscess, dental pain, dental injury, elevated blood pressure, hypertension.  36 year old male presents to the ED with complaint of dental pain but also was noted that his blood pressure was elevated.  Patient states that there is a family history of hypertension but he has not been diagnosed.  Currently does not have a PCP.  On examination there appears to be a posterior molar that is partially avulsed with edema noted to the gum surrounding this tooth.  No active drainage.  Patient was made aware that most likely this is a dental abscess and that he would need to see a dental clinic.  A list of clinics were provided for him.  A recheck of his blood pressure prior to discharge was 147/101.  Patient denied any headache, dizziness, chest pain, shortness of breath.  He was made aware that this needs to be reevaluated and a list of clinics in the area were given to him so that he can have his blood pressure rechecked and evaluated for hypertension.  Information about hypertension was given to him.  He is strongly encouraged to follow-up on this.  Patient was discharged with a prescription for amoxicillin 875 twice daily for 10 days and ibuprofen every 6 hours as needed for pain.         FINAL CLINICAL IMPRESSION(S) / ED DIAGNOSES   Final diagnoses:  Dental abscess  Elevated blood pressure reading     Rx / DC Orders   ED Discharge Orders          Ordered    amoxicillin (AMOXIL) 875 MG tablet  2 times daily        02/20/21 1454    ibuprofen (ADVIL) 600 MG tablet  Every 6 hours PRN        02/20/21 1454             Note:  This document was prepared using Dragon voice recognition software and may include unintentional dictation errors.   Johnn Hai,  PA-C 02/20/21 1537    Nena Polio, MD 02/20/21 1539

## 2021-07-01 IMAGING — CR DG CHEST 2V
1 series · 2 of 2 positions shown · non-contrast
Comparison: None.

CLINICAL DATA: Chest pain.

EXAM:
CHEST - 2 VIEW

[Series 1: dg chest 2 view · 0.14mm/px · 2 of 2 slices shown]
[im 1/2]
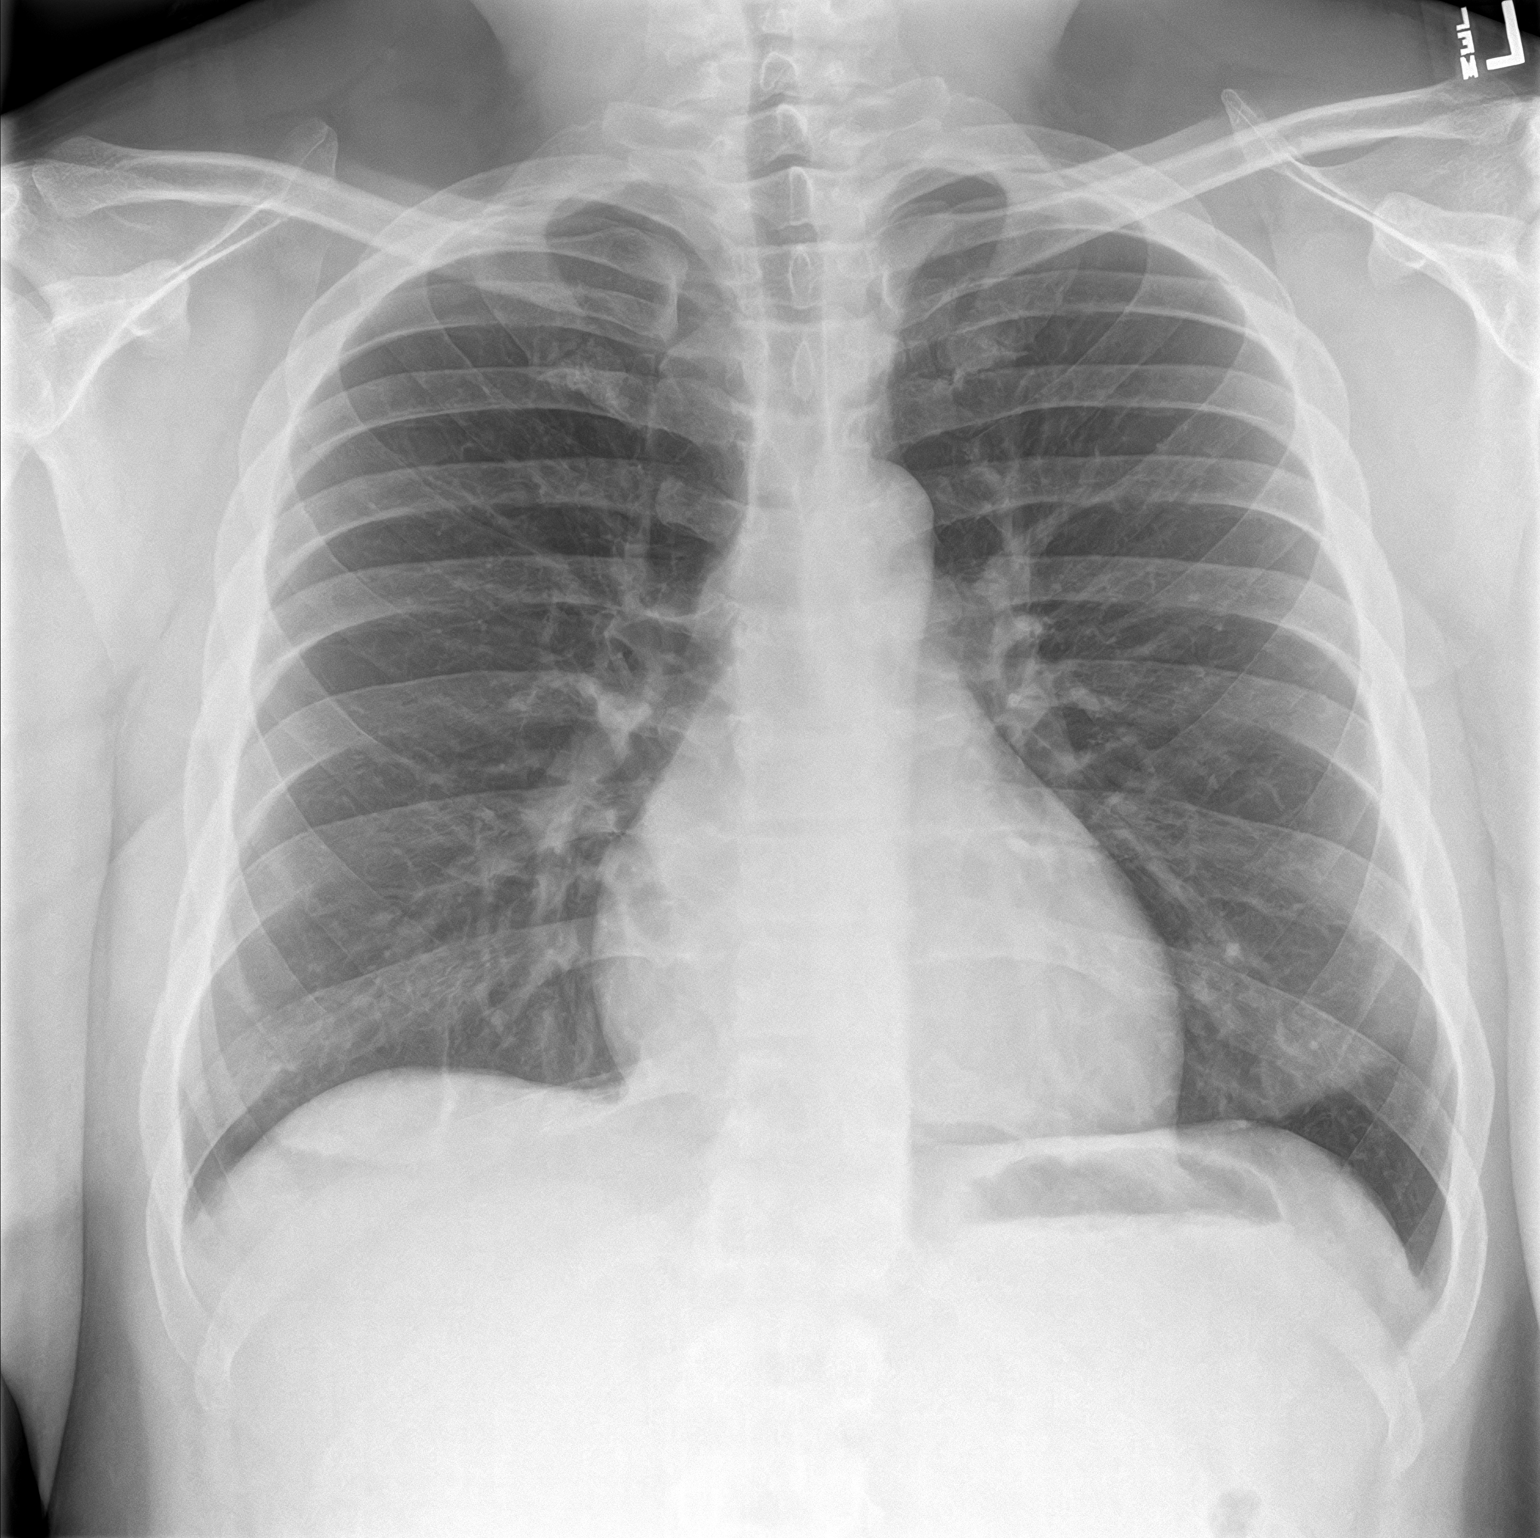
[im 2/2]
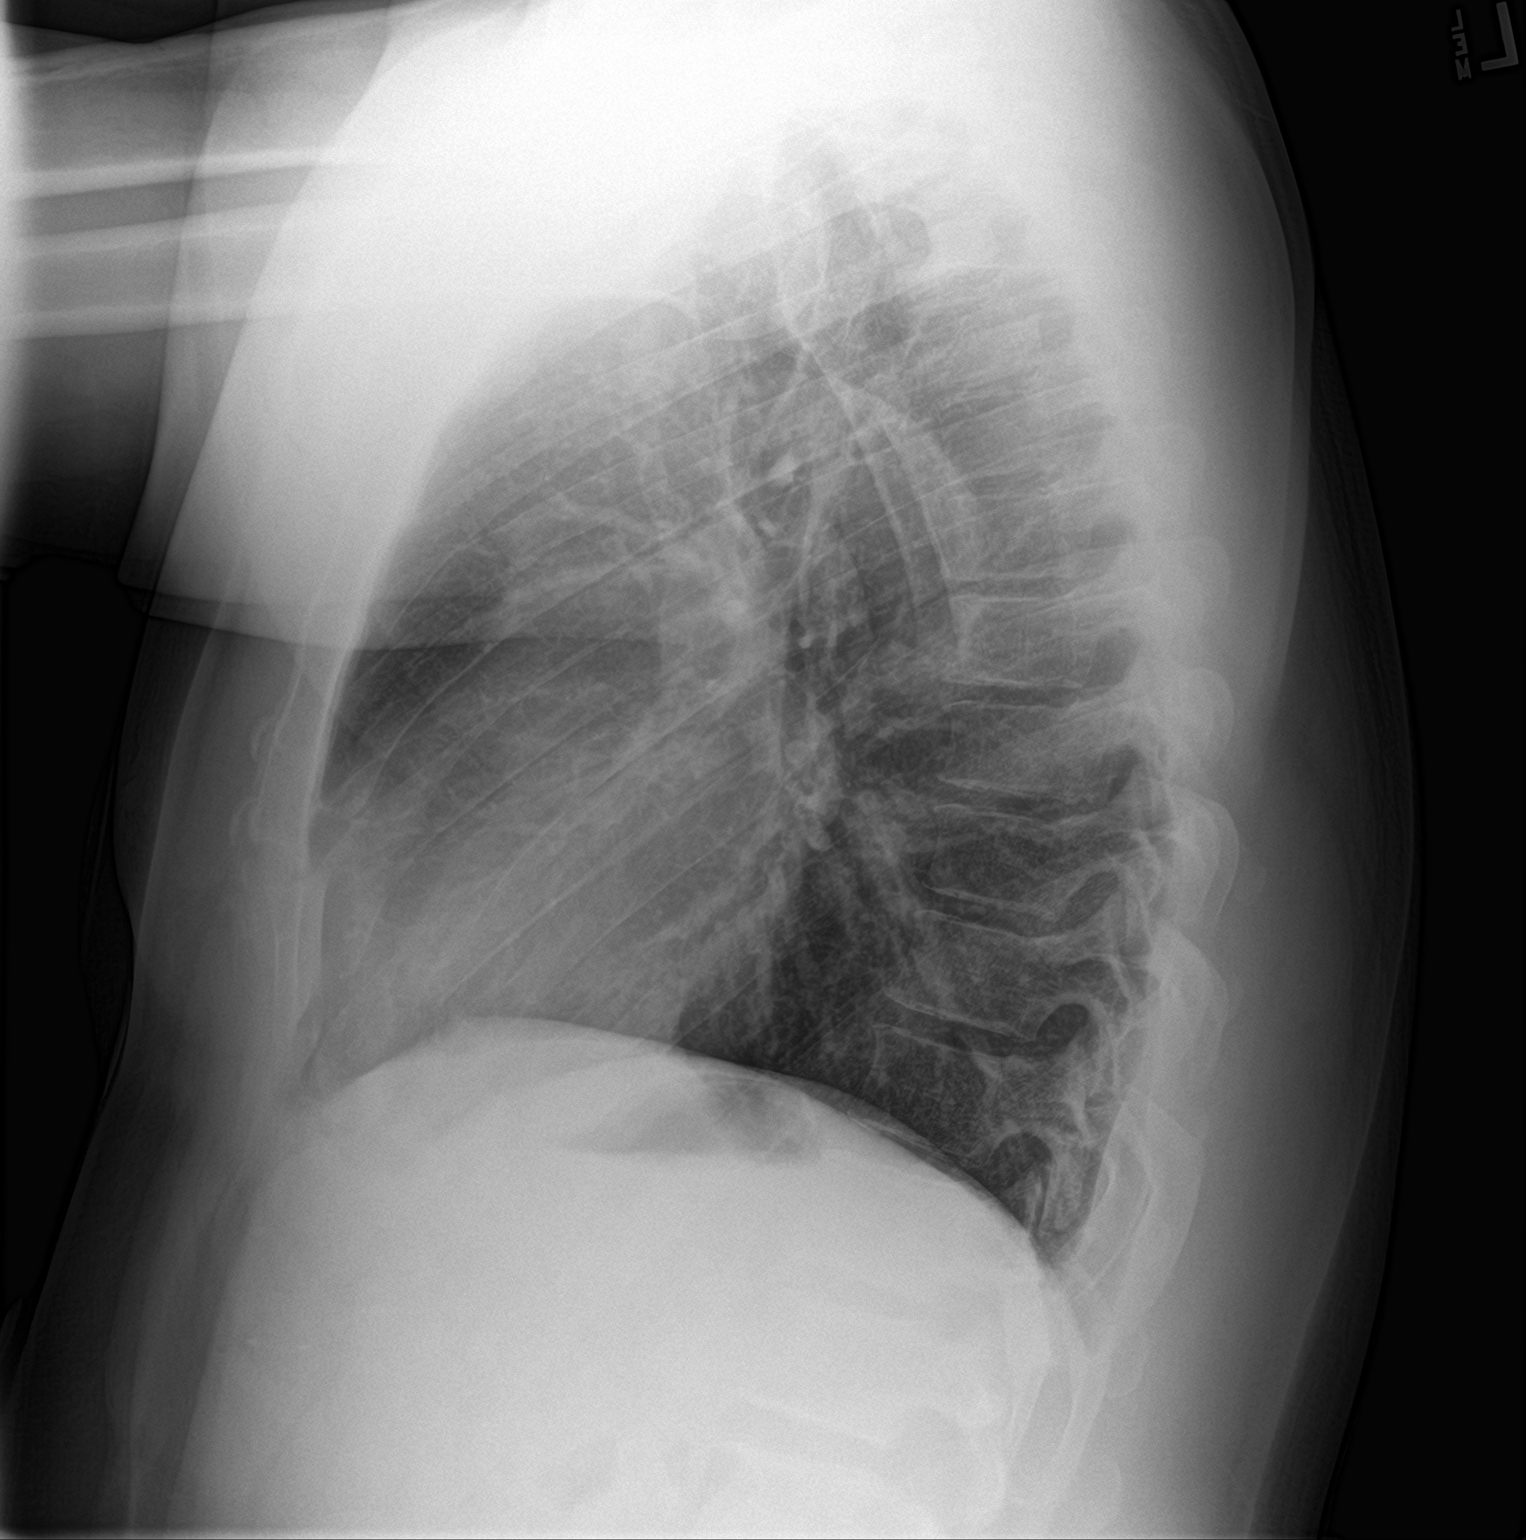

[2 of 2 positions shown; findings below may reference images not displayed]

FINDINGS: The heart size and mediastinal contours are within normal limits.
Both lungs are clear. No pneumothorax or pleural effusion is noted.
The visualized skeletal structures are unremarkable.
IMPRESSION: No active cardiopulmonary disease.

## 2021-09-01 IMAGING — DX DG FOOT COMPLETE 3+V*R*
3 series · 3 of 3 positions shown · non-contrast
Comparison: None.

CLINICAL DATA: Twisting injury while running, initial encounter

EXAM:
RIGHT FOOT COMPLETE - 3+ VIEW

[foot ap]
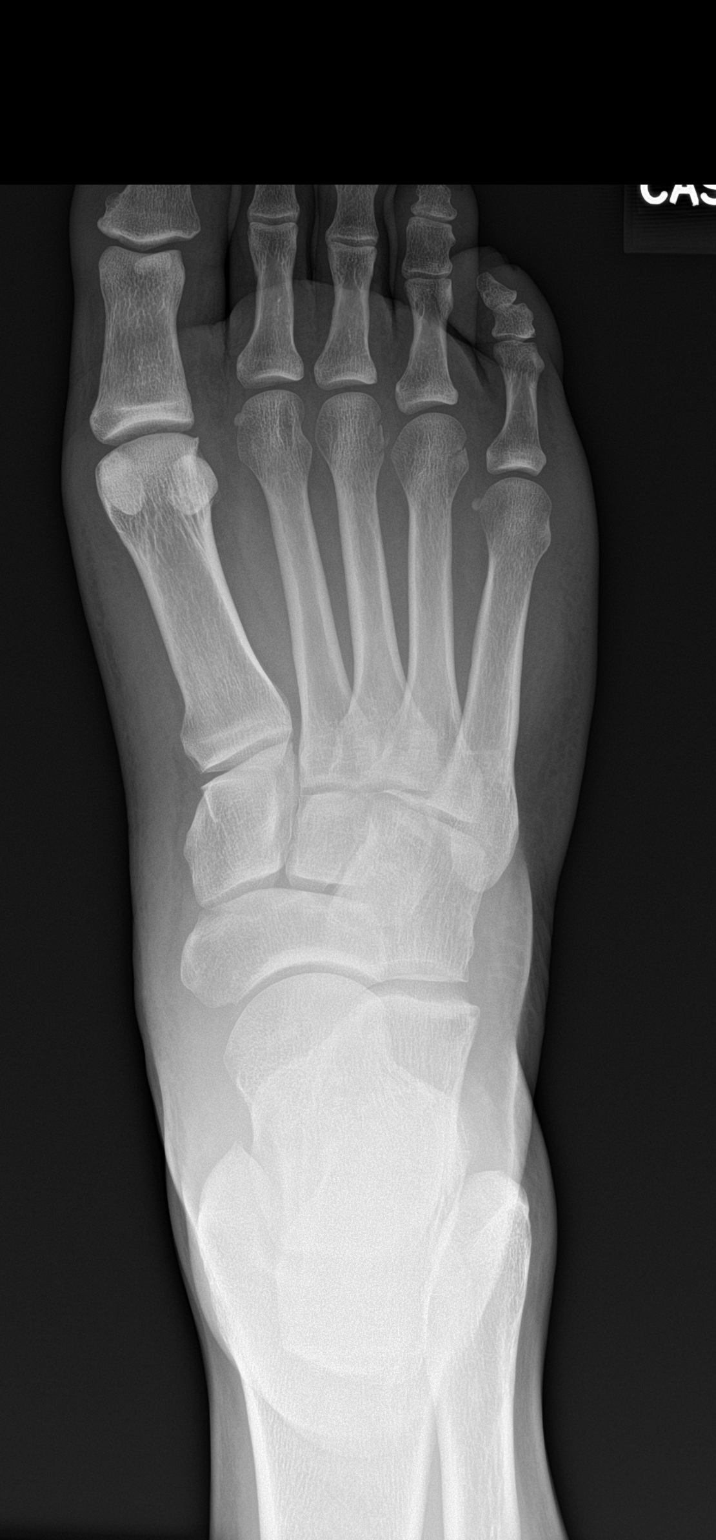

[foot obl]
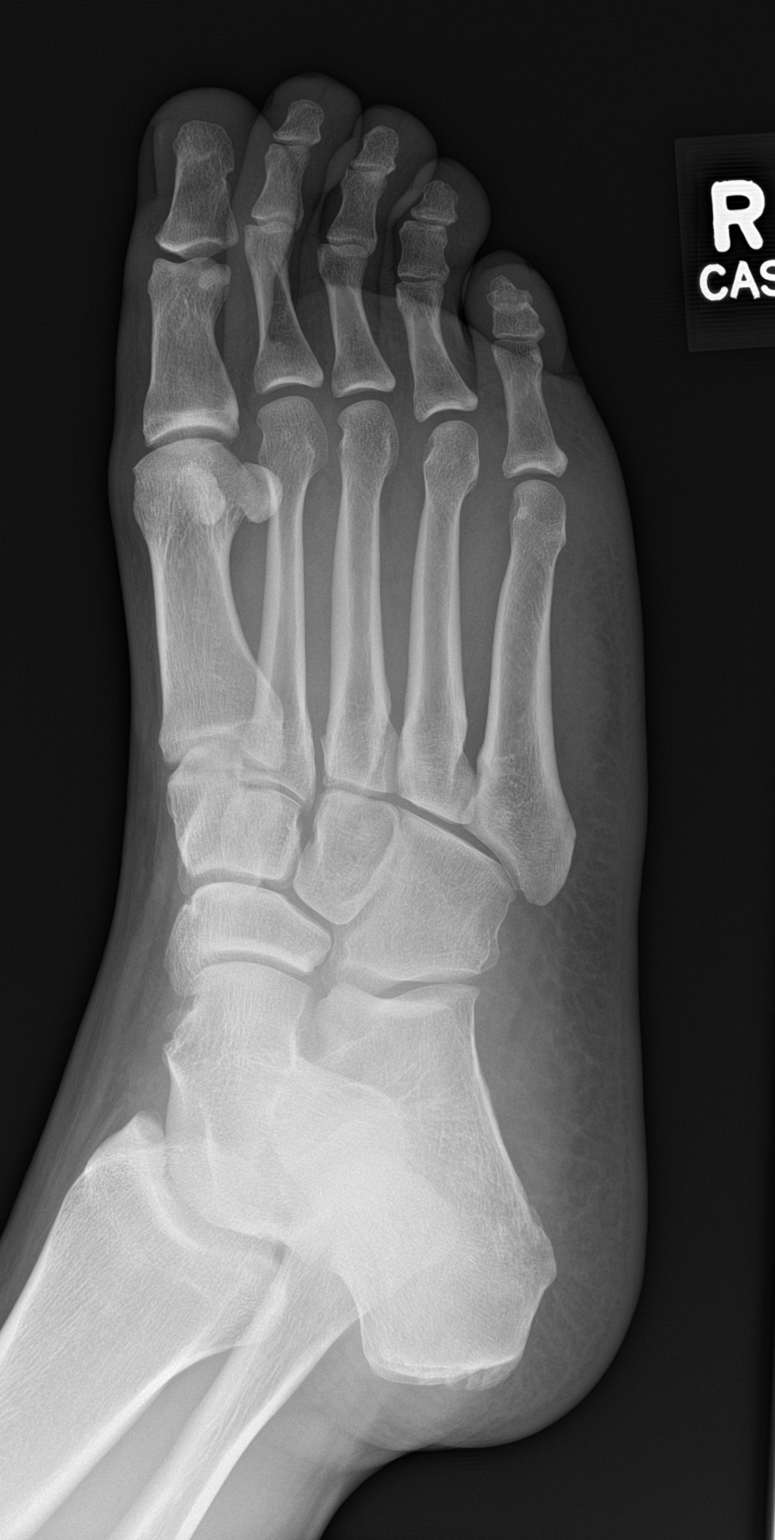

[foot lat]
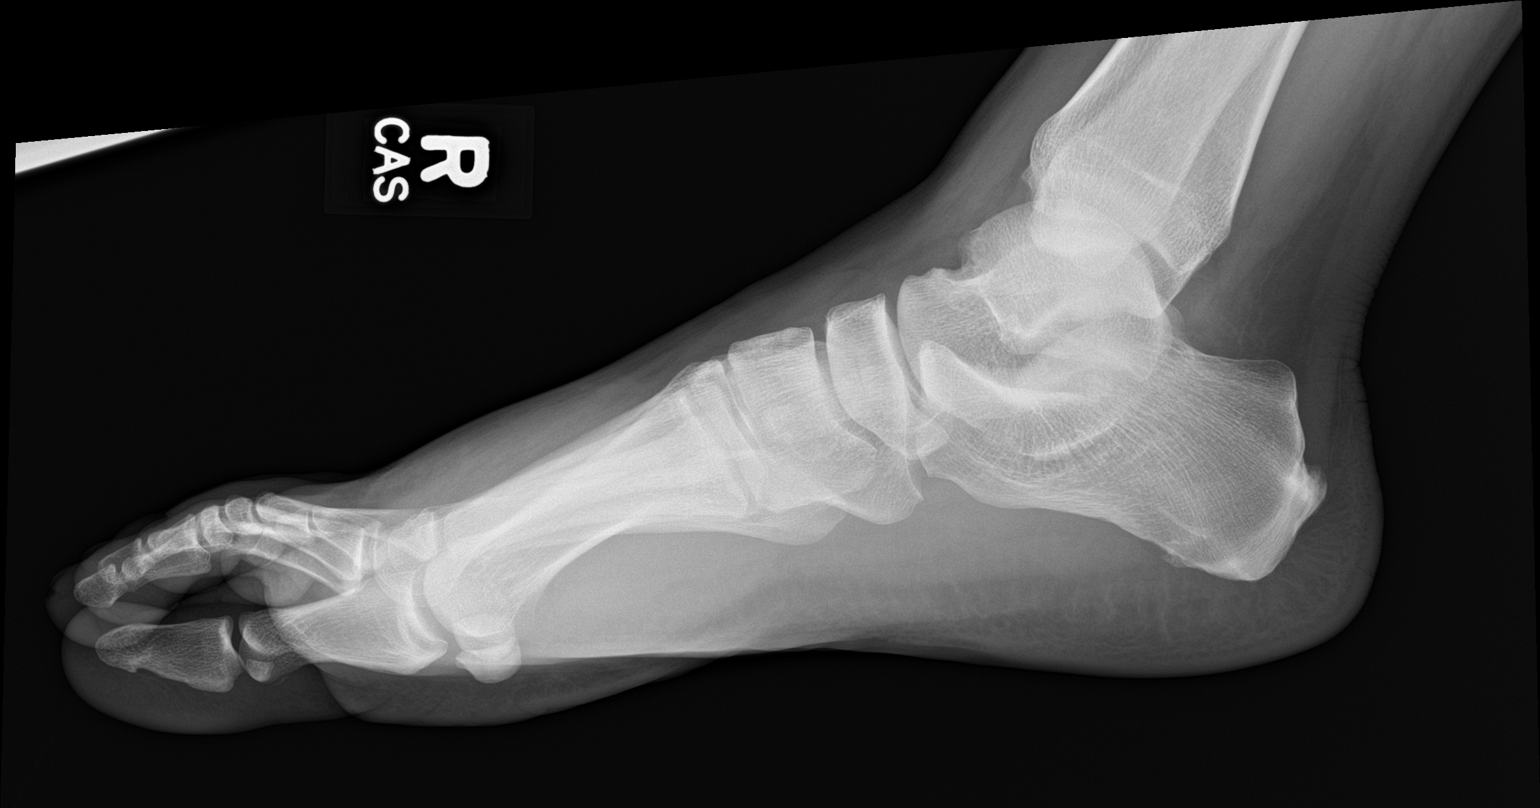

[3 of 3 positions shown; findings below may reference images not displayed]

FINDINGS: Undisplaced fractures involving the distal aspects of the third,
fourth and fifth metatarsals are seen. The fifth metatarsal fracture
is subtle. Associated soft tissue swelling is noted. No other focal
abnormality is seen.
IMPRESSION: Distal third through fifth metatarsal fractures as described.

## 2021-09-01 IMAGING — DX DG FOOT COMPLETE 3+V*L*
3 series · 3 of 3 positions shown · non-contrast
Comparison: None.

CLINICAL DATA: Pain after twisting injury

EXAM:
LEFT FOOT - COMPLETE 3+ VIEW

[foot ap]
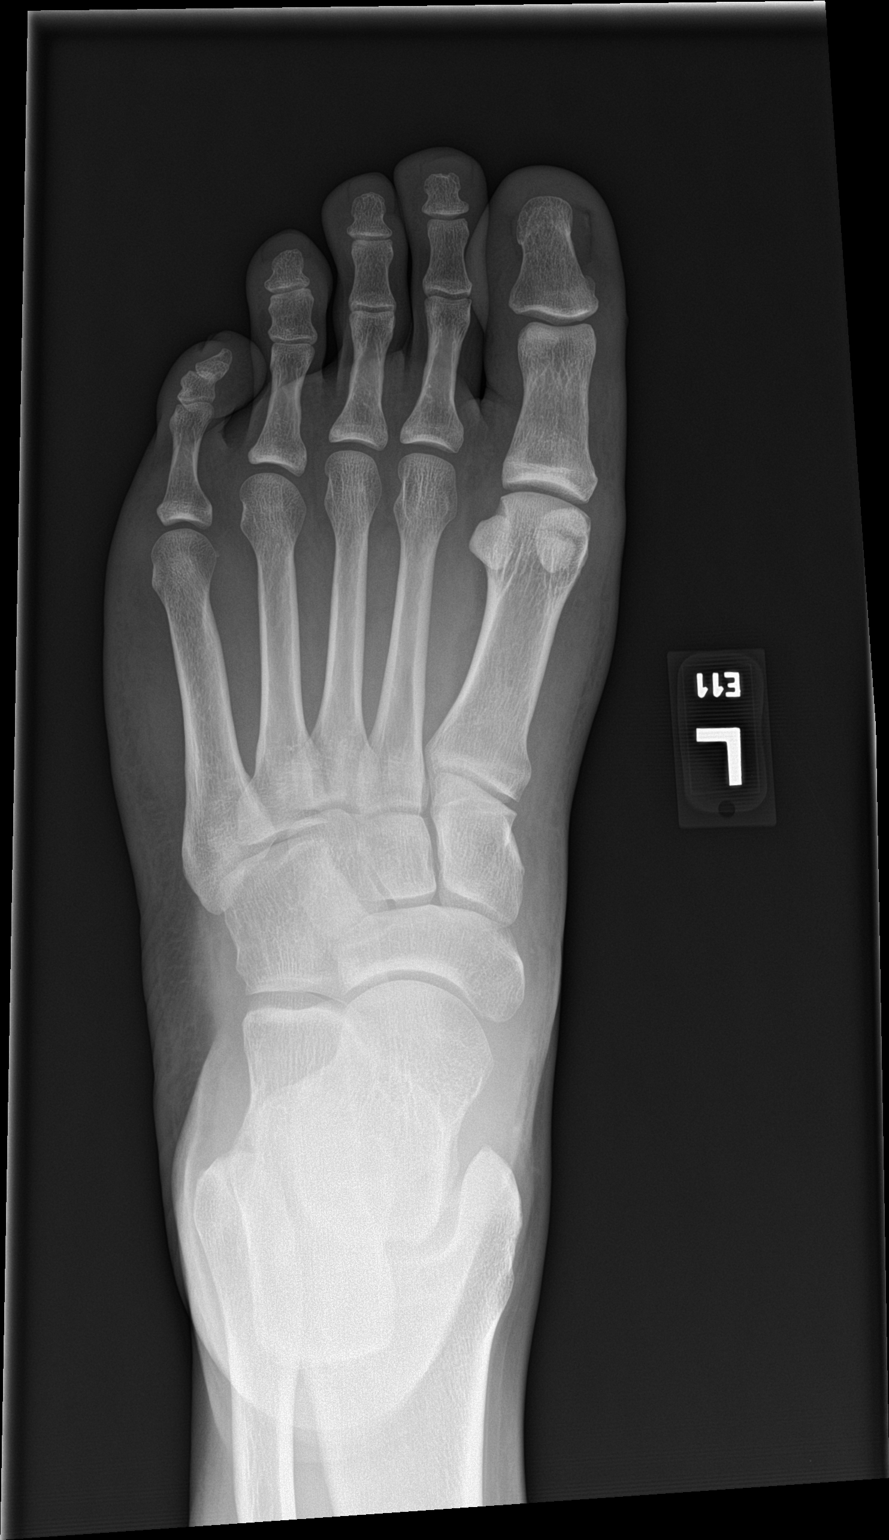

[foot obl]
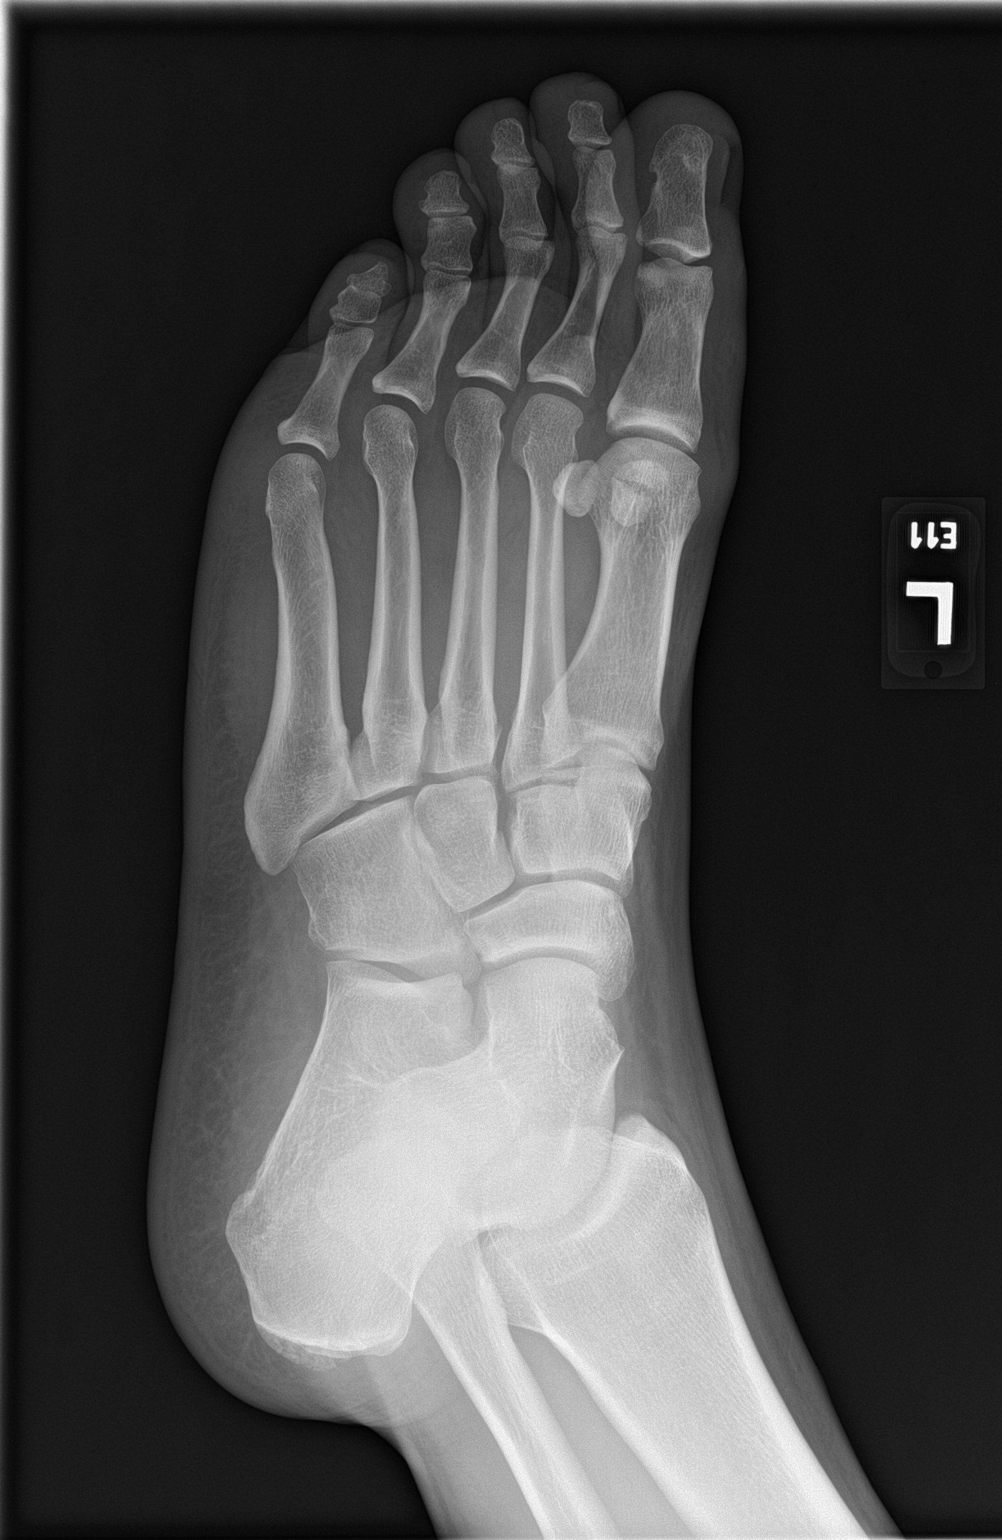

[foot lat]
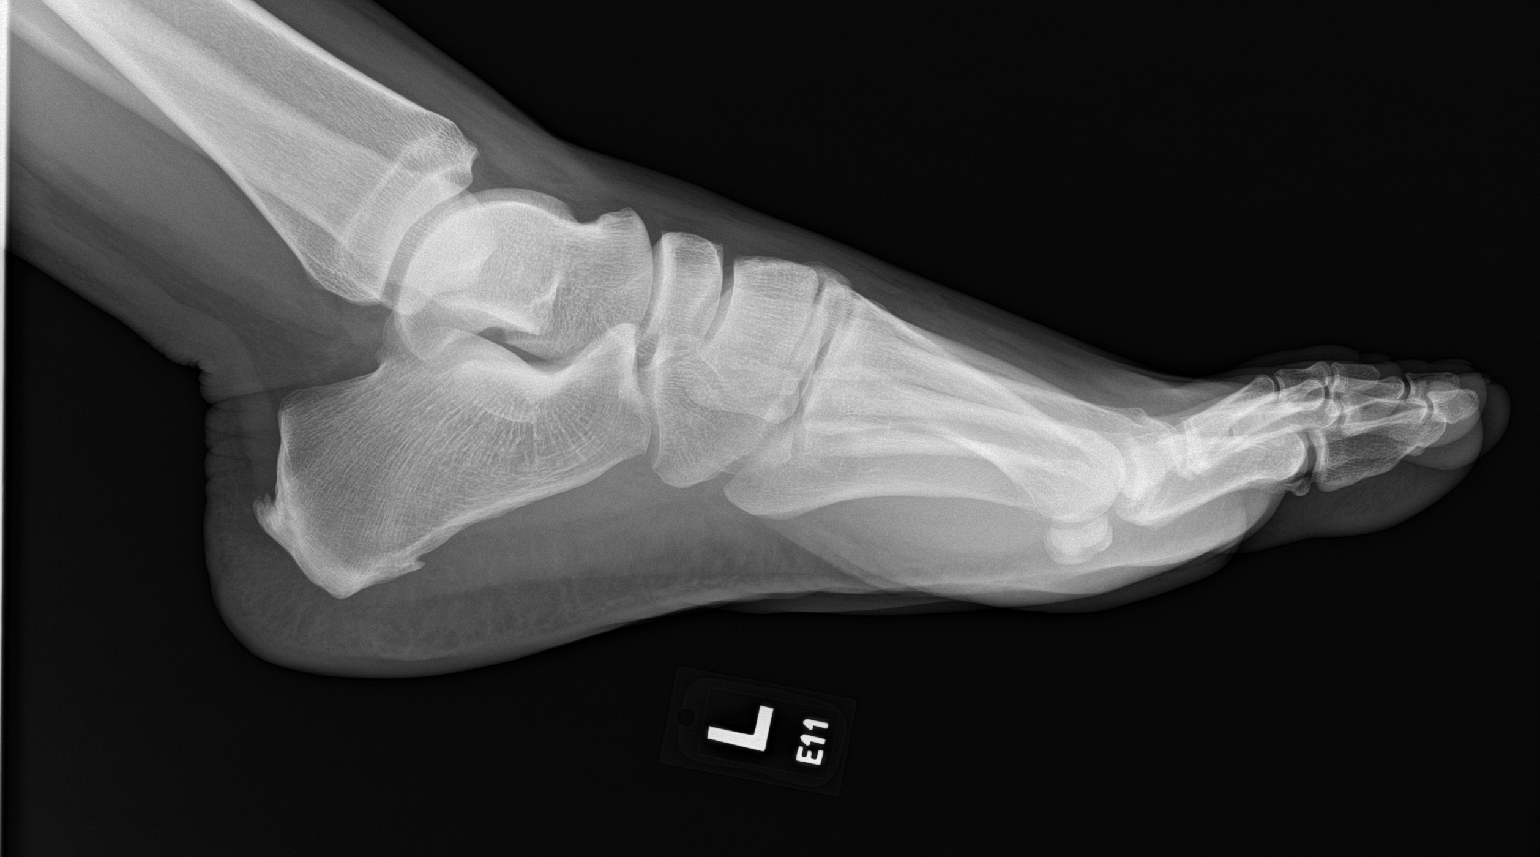

[3 of 3 positions shown; findings below may reference images not displayed]

FINDINGS: Frontal, oblique, and lateral views obtained. No fracture or
dislocation. Joint spaces appear normal. No erosive change. There
are small posterior and inferior calcaneal spurs.
IMPRESSION: Calcaneal spurs. No appreciable joint space narrowing. No fracture
or dislocation.

## 2021-12-18 ENCOUNTER — Emergency Department
Admission: EM | Admit: 2021-12-18 | Discharge: 2021-12-18 | Disposition: A | Discharge status: Home / Self Care | Payer: Self-pay | Attending: Emergency Medicine | Admitting: Emergency Medicine

## 2021-12-18 ENCOUNTER — Other Ambulatory Visit: Payer: Self-pay

## 2021-12-18 ENCOUNTER — Encounter: Payer: Self-pay | Admitting: Emergency Medicine

## 2021-12-18 DIAGNOSIS — I10 Essential (primary) hypertension: Secondary | ICD-10-CM | POA: Insufficient documentation

## 2021-12-18 DIAGNOSIS — K047 Periapical abscess without sinus: Secondary | ICD-10-CM | POA: Insufficient documentation

## 2021-12-18 MED ORDER — AMLODIPINE BESYLATE 5 MG PO TABS: 5.0000 mg | ORAL_TABLET | Freq: Every day | ORAL | 2 refills | Status: DC

## 2021-12-18 MED ORDER — AMOXICILLIN 875 MG PO TABS: 875.0000 mg | ORAL_TABLET | Freq: Two times a day (BID) | ORAL | 0 refills | Status: DC

## 2021-12-18 NOTE — ED Provider Notes (Signed)
[facility] Provider Note    Event Date/Time   First MD Initiated Contact with Patient 12/18/21 [phone]     (approximate)   History   Dental Pain   HPI  David Giles is a 36 y.o. male presents to the ED with complaint of dental pain.  Patient complains of pain for the last 3 days in the left lower molar.  Patient had some leftover amoxicillin that he took.  It was also noted that patient had elevated blood pressure and although records indicate that he has a history of hypertension and at one time was prescribed hydrochlorothiazide he states he never took it.  He does have a family history of hypertension.  He denies any other health problems.     Physical Exam   Triage Vital Signs: ED Triage Vitals  Enc Vitals Group     BP 12/18/21 0807 (!) 188/125     Pulse Rate 12/18/21 0807 60     Resp --      Temp 12/18/21 0807 98.5 F (36.9 C)     Temp Source 12/18/21 0807 Oral     SpO2 12/18/21 0807 99 %     Weight 12/18/21 0804 260 lb 2.3 oz (118 kg)     Height 12/18/21 0804 5\' 11"  (1.803 m)     Head Circumference --      Peak Flow --      Pain Score 12/18/21 0804 10     Pain Loc --      Pain Edu? --      Excl. in [place]? --     Most recent vital signs: Vitals:   12/18/21 0807 12/18/21 0825  BP: (!) 188/125 (!) 163/122  Pulse: 60   Temp: 98.5 F (36.9 C)   SpO2: 99%      General: Awake, no distress.  CV:  Good peripheral perfusion.  Resp:  Normal effort.  Abd:  No distention.  Other:  Left lower posterior molar is an extremely poor repair and is almost even with the gumline.  Gums are edematous.  Neck is supple without cervical lymphadenopathy.  No active drainage is noted.   ED Results / Procedures / Treatments   Labs (all labs ordered are listed, but only abnormal results are displayed) Labs Reviewed - No data to display      PROCEDURES:  Critical Care performed:   Procedures   MEDICATIONS ORDERED IN ED: Medications - No data  to display   IMPRESSION / MDM / ASSESSMENT AND PLAN / ED COURSE  I reviewed the triage vital signs and the nursing notes.   Differential diagnosis includes, but is not limited to, dental abscess, dental pain, elevated blood pressure, hypertension untreated and uncontrolled.  36 year old male presents to the ED with dental pain for the last 3 days however in looking at the tooth he has been extremely poor repair for months.  We also discussed his elevated blood pressure and although his records indicate that he was taking hydrochlorothiazide he states that he has never taken any medication.  There is positive family history.  We discussed the importance of controlling his blood pressure as even prior to discharge at 163/122 his blood pressure is high.  He is to call [facility] health services to make an appointment to get a primary care provider.  A prescription for amlodipine was sent to the pharmacy for 1/day with refills for an additional 2 months until he can be seen.  He was also given  a list of dental clinics to follow-up with and a prescription for amoxicillin 875 twice daily for 10 days.      Patient's presentation is most consistent with acute, uncomplicated illness.  FINAL CLINICAL IMPRESSION(S) / ED DIAGNOSES   Final diagnoses:  Dental abscess  Uncontrolled hypertension     Rx / DC Orders   ED Discharge Orders          Ordered    amLODipine (NORVASC) 5 MG tablet  Daily        12/18/21 0842    amoxicillin (AMOXIL) 875 MG tablet  2 times daily        12/18/21 4801             Note:  This document was prepared using Dragon voice recognition software and may include unintentional dictation errors.   Tommi Rumps, PA-C 12/18/21 6553    Chesley Noon, MD 12/18/21 [phone]

## 2021-12-18 NOTE — Discharge Instructions (Addendum)
Call and make arrangements to see a dentist.  Also a individual sheet with 2 walk-in clinics is listed with your discharge papers.  Your tooth will continue to give you problems until you have been seen and most likely your tooth will need to be removed.  Begin taking the amoxicillin twice a day for the next 10 days.  You may continue taking Tylenol or ibuprofen as needed.  Also amlodipine 5 mg was sent to the pharmacy for your uncontrolled hypertension.  Your blood pressure while in the emergency department was 188/125 and the second time 163/122.  With a family history of hypertension you most likely will need to be on blood pressure medication the rest of your life.  It is important that you get a primary care provider for continued management.  Call the [facility] health services to get an appointment.  OPTIONS FOR DENTAL FOLLOW UP CARE  [place] [facility] and [facility] - Local Safety Net Dental Clinics [email or web address]   [facility] [phone])  [facility] [phone])  [place] [phone])  [facility] [phone])  [facility] [phone]) This clinic caters to the indigent population and is on a lottery system. Location: [employer] [facility], [employer], [address] [facility] Clinic Hours: Wednesdays from 6pm - 9pm, patients seen by a lottery system. For dates, call or go to [email or web address] Services: Cleanings, fillings and simple extractions. Payment Options: DENTAL WORK IS FREE OF CHARGE. Bring proof of income or support. Best way to get seen: Arrive at 5:15 pm - this is a lottery, NOT first come/first serve, so arriving earlier will not increase your chances of being seen.     [facility] [phone] Select option 1 for emergencies   Location: [facility],  [place], [address] Dent Clinic Hours: No walk-ins accepted - call the day before to schedule an appointment. Check in times are 9:30 am and 1:30 pm. Services: Simple extractions, temporary fillings, pulpectomy/pulp debridement, uncomplicated abscess drainage. Payment Options: PAYMENT IS DUE AT THE TIME OF SERVICE.  Fee is usually $100-200, additional surgical procedures (e.g. abscess drainage) may be extra. Cash, checks, Visa/MasterCard accepted.  Can file Medicaid if patient is covered for dental - patient should call case worker to check. No discount for [facility] patients. Best way to get seen: MUST call the day before and get onto the schedule. Can usually be seen the next 1-2 days. No walk-ins accepted.     [facility] [phone]   Location: [facility], [address], [place] Clinic Hours: M, W, Th, F 8am or 1:30pm, Tues 9a or 1:30 - first come/first served. Services: Simple extractions, temporary fillings, uncomplicated abscess drainage.  You do not need to be an [facility] resident. Payment Options: PAYMENT IS DUE AT THE TIME OF SERVICE. Dental insurance, otherwise sliding scale - bring proof of income or support. Depending on income and treatment needed, cost is usually $50-200. Best way to get seen: Arrive early as it is first come/first served.     [facility] [phone]   Location: [address] Clinic Hours: Mon-Thu 8a-5p Services: Most basic dental services including extractions and fillings. Payment Options: PAYMENT IS DUE AT THE TIME OF SERVICE. Sliding scale, up to 50% off - bring proof if income or support. Medicaid with dental option accepted. Best way to get seen: Call to  schedule an appointment, can usually be seen within 2 weeks OR they will try to see walk-ins - show up at 8a or 2p (you may have to wait).     [facility] [phone] [place] RESIDENTS ONLY   Location: [facility], [address] [place], [place] [address] Clinic Hours: By appointment only. Monday - Thursday 8am-5pm, Friday 8am-12pm Services: Cleanings, fillings, extractions. Payment Options: PAYMENT IS DUE AT THE TIME OF SERVICE. Cash, Visa or MasterCard. Sliding scale - $30 minimum per service. Best way to get seen: Come in to office, complete packet and make an appointment - need proof of income or support monies for each household member and proof of [facility] residence. Usually takes about a month to get in.     [facility] [phone]   Location: [address], [facility] Clinic Hours: Walk-in Urgent Care Dental Services are offered Monday-Friday mornings only. The numbers of emergencies accepted daily is limited to the number of providers available. Maximum 15 - Mondays, Wednesdays & Thursdays Maximum 10 - Tuesdays & Fridays Services: You do not need to be a [facility] resident to be seen for a dental emergency. Emergencies are defined as pain, swelling, abnormal bleeding, or dental trauma. Walkins will receive x-rays if needed. NOTE: Dental cleaning is not an emergency. Payment Options: PAYMENT IS DUE AT THE TIME OF SERVICE. Minimum co-pay is $40.00 for uninsured patients. Minimum co-pay is $3.00 for Medicaid with dental coverage. Dental Insurance is accepted and must be presented at time of visit. Medicare does not cover dental. Forms of payment: Cash, credit card, checks. Best way to get seen: If not previously registered with the clinic, walk-in dental registration begins at 7:15 am and is on a first come/first serve basis. If previously registered with the clinic, call to make an appointment.     [facility] [phone] [place] RESIDENTS ONLY   Location: [address], [place], [place] Clinic Hours: Mon-Thu  10a-2p Services: Extractions only! Payment Options: FREE (donations accepted) - bring proof of income or support Best way to get seen: Call and schedule an appointment OR come at 8am on the 1st Monday of every month (except for holidays) when it is first come/first served.     [facility] [phone]   Location: [address] [place], [place] Clinic Hours: Friday mornings Services, Payment Options, Best way to get seen: Call for info

## 2021-12-18 NOTE — ED Notes (Addendum)
Ongoing for 2/3 days. Pain getting worse. Bottom left. 2nd molar. Tried aleve no help, took amoxicillian 4 am this morning, meds from earlier.

## 2021-12-27 NOTE — Progress Notes (Deleted)
   There were no vitals taken for this visit.   Subjective:    Patient ID: David Giles, male    DOB: Sep 25, 1985, 36 y.o.   MRN: [id number]  HPI: David Giles is a 36 y.o. male  No chief complaint on file.  Establish care:  His last physical was ***.  He has a history of ***.  Family history includes***.  Relevant past medical, surgical, family and social history reviewed and updated as indicated. Interim medical history since our last visit reviewed. Allergies and medications reviewed and updated.  Review of Systems  Constitutional: Negative for fever or weight change.  Respiratory: Negative for cough and shortness of breath.   Cardiovascular: Negative for chest pain or palpitations.  Gastrointestinal: Negative for abdominal pain, no bowel changes.  Musculoskeletal: Negative for gait problem or joint swelling.  Skin: Negative for rash.  Neurological: Negative for dizziness or headache.  No other specific complaints in a complete review of systems (except as listed in HPI above).      Objective:    There were no vitals taken for this visit.  Wt Readings from Last 3 Encounters:  No data found for Wt    Physical Exam  Constitutional: Patient appears well-developed and well-nourished. Obese *** No distress.  HEENT: head atraumatic, normocephalic, pupils equal and reactive to light, ears ***, neck supple, throat within normal limits Cardiovascular: Normal rate, regular rhythm and normal heart sounds.  No murmur heard. No BLE edema. Pulmonary/Chest: Effort normal and breath sounds normal. No respiratory distress. Abdominal: Soft.  There is no tenderness. Psychiatric: Patient has a normal mood and affect. behavior is normal. Judgment and thought content normal.     Assessment & Plan:   Problem List Items Addressed This Visit   None    Follow up plan: No follow-ups on file.

## 2021-12-28 ENCOUNTER — Ambulatory Visit: Payer: Self-pay | Admitting: Nurse Practitioner

## 2022-02-25 ENCOUNTER — Other Ambulatory Visit: Payer: Self-pay

## 2022-02-25 ENCOUNTER — Emergency Department
Admission: EM | Admit: 2022-02-25 | Discharge: 2022-02-25 | Disposition: A | Discharge status: Home / Self Care | Payer: Self-pay | Attending: Emergency Medicine | Admitting: Emergency Medicine

## 2022-02-25 DIAGNOSIS — U071 COVID-19: Secondary | ICD-10-CM | POA: Insufficient documentation

## 2022-02-25 DIAGNOSIS — R000 Tachycardia, unspecified: Secondary | ICD-10-CM | POA: Insufficient documentation

## 2022-02-25 LAB — CBC WITH DIFFERENTIAL/PLATELET
Abs Immature Granulocytes: 0.02 10*3/uL (ref 0.00–0.07)
Basophils Absolute: 0 10*3/uL (ref 0.0–0.1)
Basophils Relative: 0 %
Eosinophils Absolute: 0 10*3/uL (ref 0.0–0.5)
Eosinophils Relative: 1 %
HCT: 40.2 % (ref 39.0–52.0)
Hemoglobin: 13.3 g/dL (ref 13.0–17.0)
Immature Granulocytes: 0 %
Lymphocytes Relative: 7 %
Lymphs Abs: 0.5 10*3/uL — ABNORMAL LOW (ref 0.7–4.0)
MCH: 29 pg (ref 26.0–34.0)
MCHC: 33.1 g/dL (ref 30.0–36.0)
MCV: 87.8 fL (ref 80.0–100.0)
Monocytes Absolute: 1.2 10*3/uL — ABNORMAL HIGH (ref 0.1–1.0)
Monocytes Relative: 17 %
Neutro Abs: 5.2 10*3/uL (ref 1.7–7.7)
Neutrophils Relative %: 75 %
Platelets: 151 10*3/uL (ref 150–400)
RBC: 4.58 MIL/uL (ref 4.22–5.81)
RDW: 13.3 % (ref 11.5–15.5)
WBC: 6.9 10*3/uL (ref 4.0–10.5)
nRBC: 0 % (ref 0.0–0.2)

## 2022-02-25 LAB — RESP PANEL BY RT-PCR (RSV, FLU A&B, COVID)  RVPGX2
Influenza A by PCR: NEGATIVE
Influenza B by PCR: NEGATIVE
Resp Syncytial Virus by PCR: NEGATIVE
SARS Coronavirus 2 by RT PCR: POSITIVE — AB

## 2022-02-25 LAB — BASIC METABOLIC PANEL
Anion gap: 8 (ref 5–15)
BUN: 20 mg/dL (ref 6–20)
CO2: 28 mmol/L (ref 22–32)
Calcium: 9 mg/dL (ref 8.9–10.3)
Chloride: 99 mmol/L (ref 98–111)
Creatinine, Ser: 1.27 mg/dL — ABNORMAL HIGH (ref 0.61–1.24)
GFR, Estimated: >60 mL/min (ref >60)
Glucose, Bld: 94 mg/dL (ref 70–99)
Potassium: 3.7 mmol/L (ref 3.5–5.1)
Sodium: 135 mmol/L (ref 135–145)

## 2022-02-25 MED ORDER — SODIUM CHLORIDE 0.9 % IV BOLUS
1000.0000 mL | Freq: Once | INTRAVENOUS | Status: AC
  Administered 2022-02-25: 1000 mL via INTRAVENOUS

## 2022-02-25 MED ORDER — KETOROLAC TROMETHAMINE 15 MG/ML IJ SOLN
15.0000 mg | Freq: Once | INTRAMUSCULAR | Status: AC
  Administered 2022-02-25: 15 mg via INTRAVENOUS
  Filled 2022-02-25: qty 1

## 2022-02-25 NOTE — ED Triage Notes (Signed)
Pt reports woke up this am with lower back pain, bodyaches, chills and feeling weak. Pt reports work at [employer] so has been exposed to a lot.

## 2022-02-25 NOTE — Discharge Instructions (Addendum)
Your COVID test is positive.  You may continue to take Tylenol/ibuprofen per package instructions as needed for symptoms.  Please return for any new, worsening, or changes symptoms or other concerns.  It was a pleasure caring for you today.

## 2022-02-25 NOTE — ED Provider Notes (Signed)
[facility] Provider Note    Event Date/Time   First MD Initiated Contact with Patient 02/25/22 1054     (approximate)   History   Back Pain, Generalized Body Aches, and Headache   HPI  David Giles is a 37 y.o. male who presents today for evaluation of body aches, headache, nausea that began this morning.  He reports he works a lot and is around many sick people.  He has not had a fever or chills.  Denies cough or shortness of breath.  No abdominal pain or dysuria.  He reports that he feels the same as when he had COVID.  There are no problems to display for this patient.         Physical Exam   Triage Vital Signs: ED Triage Vitals  Enc Vitals Group     BP 02/25/22 1030 (!) 169/119     Pulse Rate 02/25/22 1030 (!) 113     Resp 02/25/22 1030 12     Temp 02/25/22 1030 98.4 F (36.9 C)     Temp Source 02/25/22 1030 Oral     SpO2 02/25/22 1030 100 %     Weight 02/25/22 1026 260 lb 2.3 oz (118 kg)     Height 02/25/22 1026 5\' 11"  (1.803 m)     Head Circumference --      Peak Flow --      Pain Score 02/25/22 1026 7     Pain Loc --      Pain Edu? --      Excl. in [place]? --     Most recent vital signs: Vitals:   02/25/22 1030 02/25/22 1255  BP: (!) 169/119 (!) 142/96  Pulse: (!) 113 (!) 111  Resp: 12 18  Temp: 98.4 F (36.9 C) 98.6 F (37 C)  SpO2: 100% 96%    Physical Exam Vitals and nursing note reviewed.  Constitutional:      General: Awake and alert. No acute distress.    Appearance: Normal appearance. The patient is normal weight.  HENT:     Head: Normocephalic and atraumatic.     Mouth: Mucous membranes are moist.  Eyes:     General: PERRL. Normal EOMs        Right eye: No discharge.        Left eye: No discharge.     Conjunctiva/sclera: Conjunctivae normal.  Cardiovascular:     Rate and Rhythm: Normal rate and regular rhythm.     Pulses: Normal pulses.     Heart sounds: Normal heart sounds Pulmonary:     Effort:  Pulmonary effort is normal. No respiratory distress.     Breath sounds: Normal breath sounds.  Abdominal:     Abdomen is soft. There is no abdominal tenderness. No rebound or guarding. No distention. Musculoskeletal:        General: No swelling. Normal range of motion.     Cervical back: Normal range of motion and neck supple.  Skin:    General: Skin is warm and dry.     Capillary Refill: Capillary refill takes less than 2 seconds.     Findings: No rash.  Neurological:     Mental Status: The patient is awake and alert.      ED Results / Procedures / Treatments   Labs (all labs ordered are listed, but only abnormal results are displayed) Labs Reviewed  RESP PANEL BY RT-PCR (RSV, FLU A&B, COVID)  [id number] - Abnormal; Notable for the  following components:      Result Value   SARS Coronavirus 2 by RT PCR POSITIVE (*)    All other components within normal limits  BASIC METABOLIC PANEL - Abnormal; Notable for the following components:   Creatinine, Ser 1.27 (*)    All other components within normal limits  CBC WITH DIFFERENTIAL/PLATELET - Abnormal; Notable for the following components:   Lymphs Abs 0.5 (*)    Monocytes Absolute 1.2 (*)    All other components within normal limits     EKG     RADIOLOGY     PROCEDURES:  Critical Care performed:   Procedures   MEDICATIONS ORDERED IN ED: Medications  sodium chloride 0.9 % bolus 1,000 mL (0 mLs Intravenous Stopped 02/25/22 1232)  ketorolac (TORADOL) 15 MG/ML injection 15 mg (15 mg Intravenous Given 02/25/22 1126)     IMPRESSION / MDM / ASSESSMENT AND PLAN / ED COURSE  I reviewed the triage vital signs and the nursing notes.   Differential diagnosis includes, but is not limited to, COVID, influenza, electrolyte disarray, muscle spasms.  Patient is awake and alert, mildly tachycardic on arrival though normotensive and afebrile.  He is nontoxic-appearing.  No chest pain, shortness of breath, or fever to suggest  myocarditis.  He demonstrates no increased work of breathing.  He is nontoxic in appearance.  Labs are overall reassuring without evidence of leukocytosis or electrolyte disarray.  No urinary symptoms or CVA tenderness to suggest UTI, kidney stone, or pyelonephritis.  Swab is positive for COVID-19.  Patient was made aware of this result.  He was instructed to remain in isolation unless he requires medical attention for the next 5 days.  He was given a work note.  He was instructed to wear a mask when he returns to work for an additional 5 days.  We discussed symptomatic management and return precautions.  Patient understands and agrees with plan.  He was discharged in stable condition.   Patient's presentation is most consistent with acute complicated illness / injury requiring diagnostic workup.      FINAL CLINICAL IMPRESSION(S) / ED DIAGNOSES   Final diagnoses:  [id number]     Rx / DC Orders   ED Discharge Orders     None        Note:  This document was prepared using Dragon voice recognition software and may include unintentional dictation errors.   Emeline Gins 02/25/22 1256    Blake Divine, MD 02/25/22 1432

## 2022-05-30 ENCOUNTER — Other Ambulatory Visit: Payer: Self-pay

## 2022-05-30 ENCOUNTER — Emergency Department
Admission: EM | Admit: 2022-05-30 | Discharge: 2022-05-30 | Disposition: A | Discharge status: Home / Self Care | Payer: Self-pay | Attending: Emergency Medicine | Admitting: Emergency Medicine

## 2022-05-30 DIAGNOSIS — Z20822 Contact with and (suspected) exposure to covid-19: Secondary | ICD-10-CM | POA: Insufficient documentation

## 2022-05-30 DIAGNOSIS — J020 Streptococcal pharyngitis: Secondary | ICD-10-CM | POA: Insufficient documentation

## 2022-05-30 LAB — SARS CORONAVIRUS 2 BY RT PCR: SARS Coronavirus 2 by RT PCR: NEGATIVE

## 2022-05-30 LAB — GROUP A STREP BY PCR: Group A Strep by PCR: DETECTED — AB

## 2022-05-30 MED ORDER — AMOXICILLIN 500 MG PO CAPS: 500.0000 mg | ORAL_CAPSULE | Freq: Three times a day (TID) | ORAL | 0 refills | Status: AC

## 2022-05-30 NOTE — ED Notes (Signed)
Pt declined end of visit vitals. Pt is not ill appearing, AO4/GCS15, NAD, pt ambulatory on discharge, very kind.

## 2022-05-30 NOTE — ED Triage Notes (Signed)
Pt comes with c/o sore throat, cold chills. Pt states he hasn't had cough or fever.

## 2022-05-30 NOTE — ED Provider Notes (Signed)
   [facility] Provider Note    Event Date/Time   First MD Initiated Contact with Patient 05/30/22 0940     (approximate)   History   Cough   HPI  David Giles is a 37 y.o. male who presents with complaints of sore throat, he is here with his daughter who has similar symptoms.  He denies fevers.  No difficulty swallowing although it is uncomfortable     Physical Exam   Triage Vital Signs: ED Triage Vitals  Enc Vitals Group     BP 05/30/22 0852 (!) 161/111     Pulse Rate 05/30/22 0852 100     Resp 05/30/22 0852 18     Temp 05/30/22 0852 97.6 F (36.4 C)     Temp Source 05/30/22 0852 Oral     SpO2 05/30/22 0852 98 %     Weight --      Height --      Head Circumference --      Peak Flow --      Pain Score 05/30/22 0854 4     Pain Loc --      Pain Edu? --      Excl. in GC? --     Most recent vital signs: Vitals:   05/30/22 0852  BP: (!) 161/111  Pulse: 100  Resp: 18  Temp: 97.6 F (36.4 C)  SpO2: 98%     General: Awake, no distress.  CV:  Good peripheral perfusion.  Resp:  Normal effort.  Abd:  No distention.  Other:  Pharynx: Mild erythema, purulent discharge of tonsil, no evidence of PTA   ED Results / Procedures / Treatments   Labs (all labs ordered are listed, but only abnormal results are displayed) Labs Reviewed  GROUP A STREP BY PCR - Abnormal; Notable for the following components:      Result Value   Group A Strep by PCR DETECTED (*)    All other components within normal limits  SARS CORONAVIRUS 2 BY RT PCR     EKG     RADIOLOGY     PROCEDURES:  Critical Care performed:   Procedures   MEDICATIONS ORDERED IN ED: Medications - No data to display   IMPRESSION / MDM / ASSESSMENT AND PLAN / ED COURSE  I reviewed the triage vital signs and the nursing notes. Patient's presentation is most consistent with acute complicated illness / injury requiring diagnostic workup.  Sore throat as above,  differential includes viral illness, strep  PCR test is positive for strep, will treat with amoxicillin        FINAL CLINICAL IMPRESSION(S) / ED DIAGNOSES   Final diagnoses:  Strep throat     Rx / DC Orders   ED Discharge Orders          Ordered    amoxicillin (AMOXIL) 500 MG capsule  3 times daily        05/30/22 1022             Note:  This document was prepared using Dragon voice recognition software and may include unintentional dictation errors.   [name], MD 05/30/22 1316

## 2023-02-12 ENCOUNTER — Ambulatory Visit: Payer: Self-pay

## 2023-03-15 ENCOUNTER — Ambulatory Visit: Payer: Self-pay

## 2023-03-16 ENCOUNTER — Other Ambulatory Visit: Payer: Self-pay

## 2023-03-30 ENCOUNTER — Ambulatory Visit (LOCAL_COMMUNITY_HEALTH_CENTER): Payer: Self-pay

## 2023-03-30 DIAGNOSIS — Z111 Encounter for screening for respiratory tuberculosis: Secondary | ICD-10-CM

## 2023-04-02 ENCOUNTER — Ambulatory Visit (LOCAL_COMMUNITY_HEALTH_CENTER): Payer: Self-pay

## 2023-04-02 DIAGNOSIS — Z111 Encounter for screening for respiratory tuberculosis: Secondary | ICD-10-CM

## 2023-04-02 LAB — TB SKIN TEST
Induration: 0 mm
TB Skin Test: NEGATIVE

## 2023-06-13 ENCOUNTER — Ambulatory Visit: Payer: Self-pay | Admitting: Physician Assistant

## 2023-07-16 ENCOUNTER — Other Ambulatory Visit: Payer: Self-pay

## 2023-07-16 ENCOUNTER — Emergency Department: Payer: Self-pay

## 2023-07-16 ENCOUNTER — Emergency Department
Admission: EM | Admit: 2023-07-16 | Discharge: 2023-07-16 | Disposition: A | Discharge status: Home / Self Care | Payer: Self-pay | Attending: Emergency Medicine | Admitting: Emergency Medicine

## 2023-07-16 DIAGNOSIS — M25561 Pain in right knee: Secondary | ICD-10-CM | POA: Insufficient documentation

## 2023-07-16 DIAGNOSIS — Y92410 Unspecified street and highway as the place of occurrence of the external cause: Secondary | ICD-10-CM | POA: Diagnosis not present

## 2023-07-16 DIAGNOSIS — I10 Essential (primary) hypertension: Secondary | ICD-10-CM | POA: Insufficient documentation

## 2023-07-16 MED ORDER — AMLODIPINE BESYLATE 5 MG PO TABS: 5.0000 mg | ORAL_TABLET | Freq: Every day | ORAL | 6 refills | Status: DC
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
## 2023-07-16 NOTE — Discharge Instructions (Signed)
 The x-ray of your right knee did not show any fractures today.   You can take 650 mg of Tylenol  and 600 mg of ibuprofen  every 6 hours as needed for pain. You can use ice, heat, muscle creams and other topical pain relievers as well.  You will likely be more sore tomorrow than you are today, this is normal. After this your pain should slowly be improving. If not, you need to be seen by another health care provider. This could be your PCP, urgent care or in the emergency department. Return to the emergency department specifically, if you have development of chest pain, shortness of breath, abdominal pain, new abdominal bruising or any other symptom personally concerning to you.  Your blood pressure was elevated during today's visit.  Please begin taking the amlodipine , 5 mg, once a day.  I sent enough refills to get you to your primary care appointment in December.  I have also placed a referral for a primary care provider to see if you can get in sooner than December.  Below is information for offices in the area that you can reach out to to try to schedule sooner appointment with.  Please go to the following website to schedule new (and existing) patient appointments:   [email or web address]   The following is a list of primary care offices in the area who are accepting new patients at this time.  Please reach out to one of them directly and let them know you would like to schedule an appointment to follow up on an Emergency Department visit, and/or to establish a new primary care provider (PCP).  There are likely other primary care clinics in the are who are accepting new patients, but this is an excellent place to start:  [facility] Lead physician: Dr Aden Agreste [address] [place], [place] [address] [phone]  [facility] Lead Physician: Dr Arleen Lacer [address], [place], [place] [address] [phone]  [facility]  Lead Physician: Dr Terre Ferri [address] [place], [place] [address] [phone]  [facility] Lead Physician: Dr Audrie Blind [address] [place], [place] [address] [phone]  [facility] Lead Physician: Dr Janna Melter [address] [employer], [place], [place] [address] [phone]

## 2023-08-01 ENCOUNTER — Ambulatory Visit: Payer: Self-pay

## 2023-10-13 ENCOUNTER — Encounter (HOSPITAL_COMMUNITY): Payer: Self-pay | Admitting: *Deleted

## 2023-10-13 ENCOUNTER — Emergency Department (HOSPITAL_COMMUNITY)
Admission: EM | Admit: 2023-10-13 | Discharge: 2023-10-13 | Disposition: A | Discharge status: Home / Self Care | Attending: Emergency Medicine | Admitting: Emergency Medicine

## 2023-10-13 ENCOUNTER — Other Ambulatory Visit: Payer: Self-pay

## 2023-10-13 DIAGNOSIS — R509 Fever, unspecified: Secondary | ICD-10-CM | POA: Diagnosis present

## 2023-10-13 DIAGNOSIS — Z79899 Other long term (current) drug therapy: Secondary | ICD-10-CM | POA: Insufficient documentation

## 2023-10-13 DIAGNOSIS — I10 Essential (primary) hypertension: Secondary | ICD-10-CM | POA: Insufficient documentation

## 2023-10-13 DIAGNOSIS — U071 COVID-19: Secondary | ICD-10-CM | POA: Diagnosis not present

## 2023-10-13 LAB — I-STAT CHEM 8, ED
BUN: 18 mg/dL (ref 6–20)
Calcium, Ion: 1.16 mmol/L (ref 1.15–1.40)
Chloride: 101 mmol/L (ref 98–111)
Creatinine, Ser: 1.5 mg/dL — ABNORMAL HIGH (ref 0.61–1.24)
Glucose, Bld: 105 mg/dL — ABNORMAL HIGH (ref 70–99)
HCT: 44 % (ref 39.0–52.0)
Hemoglobin: 15 g/dL (ref 13.0–17.0)
Potassium: 4.1 mmol/L (ref 3.5–5.1)
Sodium: 139 mmol/L (ref 135–145)
TCO2: 26 mmol/L (ref 22–32)

## 2023-10-13 LAB — URINALYSIS, ROUTINE W REFLEX MICROSCOPIC
Bacteria, UA: NONE SEEN
Bilirubin Urine: NEGATIVE
Glucose, UA: NEGATIVE mg/dL
Hgb urine dipstick: NEGATIVE
Ketones, ur: NEGATIVE mg/dL
Leukocytes,Ua: NEGATIVE
Nitrite: NEGATIVE
Protein, ur: NEGATIVE mg/dL
Specific Gravity, Urine: 1.023 (ref 1.005–1.030)
pH: 6 (ref 5.0–8.0)

## 2023-10-13 LAB — COMPREHENSIVE METABOLIC PANEL WITH GFR
ALT: 13 U/L (ref 0–44)
AST: 25 U/L (ref 15–41)
Albumin: 4 g/dL (ref 3.5–5.0)
Alkaline Phosphatase: 73 U/L (ref 38–126)
Anion gap: 9 (ref 5–15)
BUN: 16 mg/dL (ref 6–20)
CO2: 26 mmol/L (ref 22–32)
Calcium: 9.3 mg/dL (ref 8.9–10.3)
Chloride: 102 mmol/L (ref 98–111)
Creatinine, Ser: 1.46 mg/dL — ABNORMAL HIGH (ref 0.61–1.24)
GFR, Estimated: >60 mL/min (ref >60)
Glucose, Bld: 106 mg/dL — ABNORMAL HIGH (ref 70–99)
Potassium: 4.2 mmol/L (ref 3.5–5.1)
Sodium: 137 mmol/L (ref 135–145)
Total Bilirubin: 0.9 mg/dL (ref 0.0–1.2)
Total Protein: 7.2 g/dL (ref 6.5–8.1)

## 2023-10-13 LAB — CBC
HCT: 42.9 % (ref 39.0–52.0)
Hemoglobin: 14 g/dL (ref 13.0–17.0)
MCH: 28.8 pg (ref 26.0–34.0)
MCHC: 32.6 g/dL (ref 30.0–36.0)
MCV: 88.3 fL (ref 80.0–100.0)
Platelets: 161 K/uL (ref 150–400)
RBC: 4.86 MIL/uL (ref 4.22–5.81)
RDW: 13.2 % (ref 11.5–15.5)
WBC: 8.3 K/uL (ref 4.0–10.5)
nRBC: 0 % (ref 0.0–0.2)

## 2023-10-13 LAB — RESP PANEL BY RT-PCR (RSV, FLU A&B, COVID)  RVPGX2
Influenza A by PCR: NEGATIVE
Influenza B by PCR: NEGATIVE
Resp Syncytial Virus by PCR: NEGATIVE
SARS Coronavirus 2 by RT PCR: POSITIVE — AB

## 2023-10-13 MED ORDER — AMLODIPINE BESYLATE 5 MG PO TABS: 5.0000 mg | ORAL_TABLET | Freq: Every day | ORAL | 3 refills | Status: DC

## 2023-10-13 MED ORDER — NAPROXEN 250 MG PO TABS
500.0000 mg | ORAL_TABLET | Freq: Once | ORAL | Status: AC
  Administered 2023-10-13: 500 mg via ORAL
  Filled 2023-10-13: qty 2

## 2023-10-13 MED ORDER — ONDANSETRON 4 MG PO TBDP
4.0000 mg | ORAL_TABLET | Freq: Three times a day (TID) | ORAL | 0 refills | Status: AC | PRN
Start: 2023-10-13 — End: ?

## 2023-10-13 MED ORDER — NAPROXEN 500 MG PO TABS: 500.0000 mg | ORAL_TABLET | Freq: Two times a day (BID) | ORAL | 0 refills | Status: AC | PRN

## 2023-10-13 MED ORDER — ACETAMINOPHEN 500 MG PO TABS
1000.0000 mg | ORAL_TABLET | Freq: Once | ORAL | Status: AC
  Administered 2023-10-13: 1000 mg via ORAL
  Filled 2023-10-13: qty 2

## 2023-10-13 NOTE — Discharge Instructions (Addendum)
 Drink plenty of fluids to prevent dehydration.  You may take 650 mg Tylenol /acetaminophen  every 4-6 hours for management of fever.  Use naproxen  as prescribed for management of bodyaches, back pain.  If you develop nausea, take Zofran  as prescribed.  You have a long documented history of high blood pressure.  It is important that you begin Norvasc  to establish better blood pressure control.  Have your blood pressure rechecked by your primary care doctor in 1 to 2 weeks.  Return to the ED for new or concerning symptoms.  ----  Thank you for the opportunity to take care of you in our Emergency Department. You have been diagnosed with high blood pressure, also known as hypertension. This means that the force of blood against the walls of your blood vessels called is too strong. It also means that your heart has to work harder to move the blood. High blood pressure usually has no symptoms, but over time, it can cause serious health problems such as Heart attack and heart failure Stroke Kidney disease and failure Vision loss With the help from your healthcare provider and some important life style changes, you can manage your blood pressure and protect your health. Please read the instructions provided on hypertension, how to manage it and how to check your blood pressure. Additionally, use the blood pressure log provided to record your blood pressures. Take the blood pressure log with you to your primary care doctor so that they can adjust your blood pressure medications if needed. Please read the instructions on follow-up appointment. Return to the ER or Call 911 right away if you have any of these symptoms: Chest pain or shortness of breath Severe headache Weakness, tingling, or numbness of your face, arms, or legs (especially on 1 side of the body) Sudden change in vision Confusion, trouble speaking, or trouble understanding speech

## 2023-10-13 NOTE — ED Triage Notes (Signed)
 Pt in ambulatory with c/o low back pain x 3 days. States the pain is burning and worse when lying down. Denies any urinary symptoms or injury. Reports chills

## 2023-10-13 NOTE — ED Provider Notes (Signed)
 [facility] EMERGENCY DEPARTMENT AT [facility] Provider Note   CSN: [id number] Arrival date & time: 10/13/23  0100     Patient presents with: Back Pain   David Giles is a 38 y.o. male.   38 year old male reports 3 days of intermittent chills and subjective fever.  He has been experiencing an aching discomfort across his low back.  Denies take any medications at home for these complaints.  No dysuria, hematuria, cough, shortness of breath, vomiting, or hx of recent trauma.  The history is provided by the patient. No language interpreter was used.  Back Pain      Prior to Admission medications   Medication Sig Start Date End Date Taking? Authorizing Provider  naproxen  (NAPROSYN ) 500 MG tablet Take 1 tablet (500 mg total) by mouth every 12 (twelve) hours as needed for mild pain (pain score 1-3) or moderate pain (pain score 4-6). 10/13/23  Yes Keith Sor, PA-C  ondansetron  (ZOFRAN -ODT) 4 MG disintegrating tablet Take 1 tablet (4 mg total) by mouth every 8 (eight) hours as needed for nausea or vomiting. 10/13/23  Yes Keith Sor, PA-C  amLODipine  (NORVASC ) 5 MG tablet Take 1 tablet (5 mg total) by mouth daily. 10/13/23 10/12/24  Keith Sor, PA-C  fluticasone  (FLONASE ) 50 MCG/ACT nasal spray Place 2 sprays into both nostrils daily. 10/13/19 10/12/20  Saunders Shona CROME, PA-C    Allergies: Patient has no known allergies.    Review of Systems  Musculoskeletal:  Positive for back pain.  Ten systems reviewed and are negative for acute change, except as noted in the HPI.    Updated Vital Signs BP (!) 184/121   Pulse (!) 108   Temp 99 F (37.2 C) (Oral)   Resp 17   Ht 5' 11 (1.803 m)   Wt 117.9 kg   SpO2 100%   BMI 36.25 kg/m   Physical Exam Vitals and nursing note reviewed.  Constitutional:      General: He is not in acute distress.    Appearance: He is well-developed. He is not diaphoretic.     Comments: Nontoxic appearing and in NAD  HENT:     Head:  Normocephalic and atraumatic.  Eyes:     General: No scleral icterus.    Conjunctiva/sclera: Conjunctivae normal.  Pulmonary:     Effort: Pulmonary effort is normal. No respiratory distress.     Comments: Respirations even and unlabored Musculoskeletal:        General: Normal range of motion.     Cervical back: Normal range of motion.  Skin:    General: Skin is warm and dry.     Coloration: Skin is not pale.     Findings: No erythema or rash.  Neurological:     Mental Status: He is alert and oriented to person, place, and time.     Coordination: Coordination normal.     Comments: Ambulatory with steady gait.  Psychiatric:        Behavior: Behavior normal.     (all labs ordered are listed, but only abnormal results are displayed) Labs Reviewed  RESP PANEL BY RT-PCR (RSV, FLU A&B, COVID)  RVPGX2 - Abnormal; Notable for the following components:      Result Value   SARS Coronavirus 2 by RT PCR POSITIVE (*)    All other components within normal limits  COMPREHENSIVE METABOLIC PANEL WITH GFR - Abnormal; Notable for the following components:   Glucose, Bld 106 (*)    Creatinine, Ser 1.46 (*)  All other components within normal limits  I-STAT CHEM 8, ED - Abnormal; Notable for the following components:   Creatinine, Ser 1.50 (*)    Glucose, Bld 105 (*)    All other components within normal limits  CBC  URINALYSIS, ROUTINE W REFLEX MICROSCOPIC    EKG: None  Radiology: No results found.   Procedures   Medications Ordered in the ED  naproxen  (NAPROSYN ) tablet 500 mg (500 mg Oral Given 10/13/23 0548)  acetaminophen  (TYLENOL ) tablet 1,000 mg (1,000 mg Oral Given 10/13/23 0548)                                    Medical Decision Making Amount and/or Complexity of Data Reviewed Labs: ordered.  Risk OTC drugs. Prescription drug management.   This patient presents to the ED for concern of myalgias and subjective fever, this involves an extensive number of treatment  options, and is a complaint that carries with it a high risk of complications and morbidity.  The differential diagnosis includes viral illness vs PNA vs rhabdomyolysis   Co morbidities that complicate the patient evaluation  HTN   Additional history obtained:  External records from outside source obtained and reviewed including historical blood pressures, which average in the 180's systolic during previous encounters.   Lab Tests:  I Ordered, and personally interpreted labs.  The pertinent results include:  Creatinine 1.46 (stable), COVID positive.   Cardiac Monitoring:  The patient was maintained on a cardiac monitor.  I personally viewed and interpreted the cardiac monitored which showed an underlying rhythm of: sinus tachycardia   Medicines ordered and prescription drug management:  I ordered medication including Tylenol  and Naproxen  for pain, low grade fever.  Reevaluation of the patient after these medicines showed that the patient remained stable I have reviewed the patients home medicines and have made adjustments as needed   Test Considered:  CXR   Problem List / ED Course:  Workup today is positive for COVID.  This is consistent with reports of aching low back discomfort with intermittent chills and fever.  Patient is nontoxic-appearing   Reevaluation:  After the interventions noted above, I reevaluated the patient and found that they have :stayed the same   Social Determinants of Health:  Lives independently   Dispostion:  After consideration of the diagnostic results and the patients response to treatment, I feel that the patent would benefit from supportive measures for management of COVID viral infection. Also counseled on need to start Norvasc  for management of HTN; initiated [facility] referral for f/u for uncontrolled hypertension. Asymptomatic today with respect to blood pressure. Return precautions discussed and provided. Patient  discharged in stable condition with no unaddressed concerns.       Final diagnoses:  COVID-19 virus infection  Uncontrolled hypertension    ED Discharge Orders          Ordered    amLODipine  (NORVASC ) 5 MG tablet  Daily        10/13/23 0529    naproxen  (NAPROSYN ) 500 MG tablet  Every 12 hours PRN        10/13/23 0529    ondansetron  (ZOFRAN -ODT) 4 MG disintegrating tablet  Every 8 hours PRN        10/13/23 0529    Referral to [facility] Care Management       Comments: Pharmacy Medication Management for Uncontrolled hypertension in the ED   10/13/23  9465               Keith Sor, PA-C 10/14/23 0001    Raford Lenis, MD 10/14/23 [phone]

## 2023-10-14 ENCOUNTER — Telehealth (HOSPITAL_COMMUNITY): Payer: Self-pay | Admitting: Emergency Medicine

## 2023-10-14 NOTE — Telephone Encounter (Cosign Needed)
 Patient asking for work note for being seen here yesterday. Patient diagnosed with COVID yesterday. I have provided a work note.

## 2023-10-15 ENCOUNTER — Telehealth: Payer: Self-pay | Admitting: *Deleted

## 2023-10-15 NOTE — Progress Notes (Signed)
 Care Guide Pharmacy Note  10/15/2023 Name: Unnamed Zeien MRN: [id number] DOB: [birth year]  Referred By: Pcp, No Reason for referral: Complex Care Management (Outreach to schedule referral with pharmacist )   Alwin Consuelo Molt is a 38 y.o. year old male who is a primary care patient of Pcp, No.  Makaveli Tyrell Megna was referred to the pharmacist for assistance related to: HTN  Successful contact was made with the patient to discuss pharmacy services including being ready for the pharmacist to call at least 5 minutes before the scheduled appointment time and to have medication bottles and any blood pressure readings ready for review. The patient agreed to meet with the pharmacist via telephone visit on 10/25/2023  Thedford Franks, CMA [place]  [facility], Ambulatory Care Center Guide Direct Dial: [phone]  Fax: [phone] Website: [email or web address]

## 2023-10-26 ENCOUNTER — Other Ambulatory Visit: Payer: Self-pay

## 2023-10-26 NOTE — Progress Notes (Signed)
   10/26/2023 Name: David Giles MRN: [id number] DOB: 08-Jun-1985  Chief Complaint  Patient presents with   Medication Management   Hypertension    David Giles is a 38 y.o. year old male who presented for a telephone visit.   They were referred to the pharmacist by an Emergency Department Provider David Light, PA-C) for assistance in managing hypertension.    Subjective:  Care Team: Primary Care Provider: Pcp, No   Medication Access/Adherence  Current Pharmacy:  [facility] [address], [place] - [address] [place] [address] Phone: [phone] Fax: [phone]  [facility] [address], [place] - [address]  [place] [address] Phone: [phone] Fax: [phone]   Patient reports affordability concerns with their medications: No  Patient reports access/transportation concerns to their pharmacy: No  Patient reports adherence concerns with their medications:  No     Hypertension:  Current medications: Amlodipine  5mg  Medications previously tried: None  Patient has a validated, automated, upper arm home BP cuff Current blood pressure readings readings: has not checked in the last few days, reports was 150s/90s the other day but he had been running around at work  Patient denies hypotensive s/sx including dizziness, lightheadedness.  Patient denies hypertensive symptoms including headache, chest pain, shortness of breath    Objective:  No results found for: HGBA1C  Lab Results  Component Value Date   CREATININE 1.50 (H) 10/13/2023   BUN 18 10/13/2023   NA 139 10/13/2023   K 4.1 10/13/2023   CL 101 10/13/2023   CO2 26 10/13/2023    No results found for: CHOL, HDL, LDLCALC, LDLDIRECT, TRIG, CHOLHDL  Medications Reviewed Today     Reviewed by David Giles, RPH (Pharmacist) on 10/26/23 at 1119  Med List Status: <None>   Medication Order Taking?  Sig Documenting Provider Last Dose Status Informant  amLODipine  (NORVASC ) 5 MG tablet [id number] Yes Take 1 tablet (5 mg total) by mouth daily. Giles Sor, PA-C  Active   fluticasone  (FLONASE ) 50 MCG/ACT nasal spray [id number]  Place 2 sprays into both nostrils daily. Saunders David CROME, PA-C  Expired 10/12/20 2359   naproxen  (NAPROSYN ) 500 MG tablet [id number]  Take 1 tablet (500 mg total) by mouth every 12 (twelve) hours as needed for mild pain (pain score 1-3) or moderate pain (pain score 4-6). Giles Sor, PA-C  Active   ondansetron  (ZOFRAN -ODT) 4 MG disintegrating tablet [id number]  Take 1 tablet (4 mg total) by mouth every 8 (eight) hours as needed for nausea or vomiting. Giles Sor, PA-C  Active               Assessment/Plan:   Hypertension: - Currently uncontrolled - Reviewed long term cardiovascular and renal outcomes of uncontrolled blood pressure - Reviewed appropriate blood pressure monitoring technique and reviewed goal blood pressure. Recommended to check home blood pressure and heart rate daily - Recommend to continue current medication therapy. Be on the lookout for call from [facility] with [facility] to setup a new PCP visit.  -If BP still elevated at PCP visit, consider dose increase on amlodipine . -Counseled on return ER precautions      David Giles, PharmD Clinical Pharmacist [phone]

## 2023-12-10 ENCOUNTER — Ambulatory Visit: Payer: Self-pay | Admitting: Nurse Practitioner

## 2024-01-05 ENCOUNTER — Ambulatory Visit: Payer: Self-pay

## 2024-01-09 ENCOUNTER — Ambulatory Visit: Payer: Self-pay | Admitting: Nurse Practitioner

## 2024-02-18 ENCOUNTER — Ambulatory Visit: Payer: Self-pay | Admitting: Nurse Practitioner

## 2024-02-18 ENCOUNTER — Encounter: Payer: Self-pay | Admitting: Nurse Practitioner

## 2024-02-18 VITALS — BP 148/89 | HR 72 | Ht 71.0 in | Wt 274.0 lb

## 2024-02-18 DIAGNOSIS — R7989 Other specified abnormal findings of blood chemistry: Secondary | ICD-10-CM | POA: Diagnosis not present

## 2024-02-18 DIAGNOSIS — Z0000 Encounter for general adult medical examination without abnormal findings: Secondary | ICD-10-CM | POA: Diagnosis not present

## 2024-02-18 DIAGNOSIS — Z23 Encounter for immunization: Secondary | ICD-10-CM | POA: Insufficient documentation

## 2024-02-18 DIAGNOSIS — I10 Essential (primary) hypertension: Secondary | ICD-10-CM | POA: Insufficient documentation

## 2024-02-18 MED ORDER — VALSARTAN 80 MG PO TABS: 80.0000 mg | ORAL_TABLET | Freq: Every day | ORAL | 0 refills | Status: AC

## 2024-02-18 NOTE — Assessment & Plan Note (Signed)
 Lab Results  Component Value Date   NA 139 10/13/2023   K 4.1 10/13/2023   CO2 26 10/13/2023   GLUCOSE 105 (H) 10/13/2023   BUN 18 10/13/2023   CREATININE 1.50 (H) 10/13/2023   CALCIUM 9.3 10/13/2023   GFRNONAA >60 10/13/2023  Starting valsartan  80 mg daily Avoid NSAIDs and other nephrotoxic agents

## 2024-02-18 NOTE — Assessment & Plan Note (Signed)
"  °  Wt Readings from Last 3 Encounters:  02/18/24 274 lb (124.3 kg)  10/13/23 259 lb 14.8 oz (117.9 kg)  07/16/23 260 lb (117.9 kg)   Body mass index is 38.22 kg/m.   Discussed lifestyle modifications for weight loss and blood pressure management.  - Advised on weight loss strategies, including 70-80% of diet being vegetables and protein, reducing carbohydrates, and portion control. - Recommended water instead of juice or soda. Advised regular moderate to vigorous exercises at least 150 minutes weekly as tolerated   "

## 2024-02-18 NOTE — Assessment & Plan Note (Addendum)
" ° ° °    02/18/2024    4:02 PM 02/18/2024    3:53 PM 10/13/2023    4:44 AM 10/13/2023    1:18 AM 10/13/2023    1:06 AM 07/16/2023    9:48 PM 07/16/2023    6:45 PM  BP/Weight  Systolic BP 148 153 184  153 171   Diastolic BP 89 88 121  127 108   Wt. (Lbs)  274  259.92   260  BMI  38.22 kg/m2  36.25 kg/m2   36.26 kg/m2   Primary hypertension Hypertension with recent elevated readings. Previously on amlodipine , currently off medication. Creatinine levels suggest potential kidney issues. Valsartan  chosen for blood pressure and kidney protection. - Prescribed valsartan  80 mg daily. - Advised on heart-healthy, low salt, low fat diet. - Recommended moderate to vigorous exercise, 30 minutes, 5 days a week. - Instructed to maintain hydration with at least 64 ounces of water daily. - Advised to avoid NSAIDs like Aleve  and ibuprofen . - Will recheck kidney function in two weeks with labs. - Scheduled follow-up appointment in four weeks for blood pressure management and physical examination.     "

## 2024-02-18 NOTE — Progress Notes (Signed)
 "  New Patient Office Visit  Subjective:  Patient ID: David Giles, male    DOB: [birth year]  Age: 39 y.o. MRN: [id number]  CC:  Chief Complaint  Patient presents with   Establish Care    HPI   Discussed the use of AI scribe software for clinical note transcription with the patient, who gave verbal consent to proceed.  History of Present Illness David Giles is a 39 year old male with hypertension who presents to establish care and manage his blood pressure.  He has a history of hypertension with recent blood pressure readings of 153/88 mmHg and 148/89 mmHg. He was previously prescribed amlodipine  5mg  daily at the ER in september 2025 but has not taken it for the past four days as he wanted to consult a physician before refilling. He typically checks his blood pressure at work and has not been monitoring it at home.  He has recently started going to the gym, with his first session being yesterday, and is seeking advice on dietary changes to help manage his blood pressure. He is not currently following any specific diet but is interested in learning more about heart-healthy eating habits.  In terms of social history, he smokes marijuana but has not used cigarettes for two years , has used cocaine in the past but  denies any current  use of cocaine. He does not vape.  Family history is significant for his father being on dialysis and having a history of strokes and high blood pressure. His mother passed away from breast cancer when he was [age]. His sister and brother are both diabetic, and his grandmother had diabetes and heart disease.  He has had some blood work done in September, which showed elevated creatinine levels.   Assessment & Plan       Past Medical History:  Diagnosis Date   Hypertension     History reviewed. No pertinent surgical history.  Family History  Problem Relation Age of Onset   Breast cancer Mother    Stroke Father    Kidney  disease Father    Diabetes Sister    Diabetes Brother    Diabetes Maternal Grandmother    Heart disease Maternal Grandmother     Social History   Socioeconomic History   Marital status: Single    Spouse name: Not on file   Number of children: Not on file   Years of education: Not on file   Highest education level: Not on file  Occupational History   Not on file  Tobacco Use   Smoking status: Former    Types: Cigarettes   Smokeless tobacco: Never  Substance and Sexual Activity   Alcohol use: Yes   Drug use: Yes    Types: Marijuana, Cocaine    Comment: no more cocaine   Sexual activity: Yes  Other Topics Concern   Not on file  Social History Narrative   Not on file   Social Drivers of Health   Tobacco Use: Medium Risk (02/18/2024)   Patient History    Smoking Tobacco Use: Former    Smokeless Tobacco Use: Never    Passive Exposure: Not on Actuary Strain: Not on file  Food Insecurity: No Food Insecurity (02/18/2024)   Epic    Worried About Radiation Protection Practitioner of Food in the Last Year: Never true    Ran Out of Food in the Last Year: Never true  Transportation Needs: No Transportation Needs (02/18/2024)   Epic  Lack of Transportation (Medical): No    Lack of Transportation (Non-Medical): No  Physical Activity: Not on file  Stress: Not on file  Social Connections: Not on file  Intimate Partner Violence: Not At Risk (02/18/2024)   Epic    Fear of Current or Ex-Partner: No    Emotionally Abused: No    Physically Abused: No    Sexually Abused: No  Depression (PHQ2-9): Low Risk (02/18/2024)   Depression (PHQ2-9)    PHQ-2 Score: 0  Alcohol Screen: Not on file  Housing: Low Risk (02/18/2024)   Epic    Unable to Pay for Housing in the Last Year: No    Number of Times Moved in the Last Year: 0    Homeless in the Last Year: No  Utilities: Not At Risk (02/18/2024)   Epic    Threatened with loss of utilities: No  Health Literacy: Not on file    ROS Review of  Systems  Constitutional:  Negative for appetite change, chills, fatigue and fever.  HENT:  Negative for congestion, postnasal drip, rhinorrhea and sneezing.   Respiratory:  Negative for cough, shortness of breath and wheezing.   Cardiovascular:  Negative for chest pain, palpitations and leg swelling.  Gastrointestinal:  Negative for abdominal pain, constipation, nausea and vomiting.  Genitourinary:  Negative for difficulty urinating, dysuria, flank pain and frequency.  Musculoskeletal:  Negative for arthralgias, back pain, joint swelling and myalgias.  Skin:  Negative for color change, pallor, rash and wound.  Neurological:  Negative for dizziness, facial asymmetry, weakness, numbness and headaches.  Psychiatric/Behavioral:  Negative for behavioral problems, confusion, self-injury and suicidal ideas.     Objective:   Today's Vitals: BP (!) 148/89   Pulse 72   Ht 5' 11 (1.803 m)   Wt 274 lb (124.3 kg)   SpO2 100%   BMI 38.22 kg/m   Physical Exam Vitals and nursing note reviewed.  Constitutional:      General: He is not in acute distress.    Appearance: Normal appearance. He is obese. He is not ill-appearing, toxic-appearing or diaphoretic.  HENT:     Mouth/Throat:     Mouth: Mucous membranes are moist.     Pharynx: Oropharynx is clear. No oropharyngeal exudate or posterior oropharyngeal erythema.  Eyes:     General: No scleral icterus.       Right eye: No discharge.        Left eye: No discharge.     Extraocular Movements: Extraocular movements intact.     Conjunctiva/sclera: Conjunctivae normal.  Cardiovascular:     Rate and Rhythm: Normal rate and regular rhythm.     Pulses: Normal pulses.     Heart sounds: Normal heart sounds. No murmur heard.    No friction rub. No gallop.  Pulmonary:     Effort: Pulmonary effort is normal. No respiratory distress.     Breath sounds: Normal breath sounds. No stridor. No wheezing, rhonchi or rales.  Chest:     Chest wall: No  tenderness.  Abdominal:     General: There is no distension.     Palpations: Abdomen is soft.     Tenderness: There is no abdominal tenderness. There is no right CVA tenderness, left CVA tenderness or guarding.  Musculoskeletal:        General: No swelling, tenderness, deformity or signs of injury.     Right lower leg: No edema.     Left lower leg: No edema.  Skin:    General:  Skin is warm and dry.     Capillary Refill: Capillary refill takes less than 2 seconds.     Coloration: Skin is not jaundiced or pale.     Findings: No bruising, erythema or lesion.  Neurological:     Mental Status: He is alert and oriented to person, place, and time.     Motor: No weakness.     Gait: Gait normal.  Psychiatric:        Mood and Affect: Mood normal.        Behavior: Behavior normal.        Thought Content: Thought content normal.        Judgment: Judgment normal.     Assessment & Plan:   Problem List Items Addressed This Visit       Cardiovascular and Mediastinum   Primary hypertension - Primary        02/18/2024    4:02 PM 02/18/2024    3:53 PM 10/13/2023    4:44 AM 10/13/2023    1:18 AM 10/13/2023    1:06 AM 07/16/2023    9:48 PM 07/16/2023    6:45 PM  BP/Weight  Systolic BP 148 153 184  153 171   Diastolic BP 89 88 121  127 108   Wt. (Lbs)  274  259.92   260  BMI  38.22 kg/m2  36.25 kg/m2   36.26 kg/m2   Primary hypertension Hypertension with recent elevated readings. Previously on amlodipine , currently off medication. Creatinine levels suggest potential kidney issues. Valsartan  chosen for blood pressure and kidney protection. - Prescribed valsartan  80 mg daily. - Advised on heart-healthy, low salt, low fat diet. - Recommended moderate to vigorous exercise, 30 minutes, 5 days a week. - Instructed to maintain hydration with at least 64 ounces of water daily. - Advised to avoid NSAIDs like Aleve  and ibuprofen . - Will recheck kidney function in two weeks with labs. - Scheduled  follow-up appointment in four weeks for blood pressure management and physical examination.          Relevant Medications   valsartan  (DIOVAN ) 80 MG tablet   Other Relevant Orders   Basic Metabolic Panel     Other   Morbid obesity (HCC)     Wt Readings from Last 3 Encounters:  02/18/24 274 lb (124.3 kg)  10/13/23 259 lb 14.8 oz (117.9 kg)  07/16/23 260 lb (117.9 kg)   Body mass index is 38.22 kg/m.   Discussed lifestyle modifications for weight loss and blood pressure management.  - Advised on weight loss strategies, including 70-80% of diet being vegetables and protein, reducing carbohydrates, and portion control. - Recommended water instead of juice or soda. Advised regular moderate to vigorous exercises at least 150 minutes weekly as tolerated        Need for influenza vaccination   Relevant Orders   Flu vaccine trivalent PF, 6mos and older(Flulaval,Afluria,Fluarix,Fluzone) (Completed)   Elevated serum creatinine   Lab Results  Component Value Date   NA 139 10/13/2023   K 4.1 10/13/2023   CO2 26 10/13/2023   GLUCOSE 105 (H) 10/13/2023   BUN 18 10/13/2023   CREATININE 1.50 (H) 10/13/2023   CALCIUM 9.3 10/13/2023   GFRNONAA >60 10/13/2023  Starting valsartan  80 mg daily Avoid NSAIDs and other nephrotoxic agents      Health care maintenance   Reviewed vaccination status with potential need for hepatitis B and HPV vaccines. - Discussed potential need for hepatitis B and HPV vaccines. - Scheduled follow-up for  physical examination and labs in four weeks.       Outpatient Encounter Medications as of 02/18/2024  Medication Sig   valsartan  (DIOVAN ) 80 MG tablet Take 1 tablet (80 mg total) by mouth daily.   [DISCONTINUED] amLODipine  (NORVASC ) 5 MG tablet Take 1 tablet (5 mg total) by mouth daily.   fluticasone  (FLONASE ) 50 MCG/ACT nasal spray Place 2 sprays into both nostrils daily. (Patient not taking: Reported on 02/18/2024)   naproxen  (NAPROSYN ) 500 MG tablet  Take 1 tablet (500 mg total) by mouth every 12 (twelve) hours as needed for mild pain (pain score 1-3) or moderate pain (pain score 4-6). (Patient not taking: Reported on 02/18/2024)   ondansetron  (ZOFRAN -ODT) 4 MG disintegrating tablet Take 1 tablet (4 mg total) by mouth every 8 (eight) hours as needed for nausea or vomiting. (Patient not taking: Reported on 02/18/2024)   No facility-administered encounter medications on file as of 02/18/2024.    Follow-up: Return in about 4 weeks (around 03/17/2024) for HTN, CPE.   Elwood Bazinet R Asiyah Pineau, FNP "

## 2024-02-18 NOTE — Patient Instructions (Signed)
 1. Primary hypertension (Primary) - valsartan  (DIOVAN ) 80 MG tablet; Take 1 tablet (80 mg total) by mouth daily.  Dispense: 90 tablet; Refill: 0 - Basic Metabolic Panel; Future  2. Need for influenza vaccination - Flu vaccine trivalent PF, 6mos and older(Flulaval,Afluria,Fluarix,Fluzone)    It is important that you exercise regularly at least 30 minutes 5 times a week as tolerated  Think about what you will eat, plan ahead. Choose  clean, green, fresh or frozen over canned, processed or packaged foods which are more sugary, salty and fatty. 70 to 75% of food eaten should be vegetables and fruit. Three meals at set times with snacks allowed between meals, but they must be fruit or vegetables. Aim to eat over a 12 hour period , example 7 am to 7 pm, and STOP after  your last meal of the day. Drink water,generally about 64 ounces per day, no other drink is as healthy. Fruit juice is best enjoyed in a healthy way, by EATING the fruit.  Thanks for choosing [facility] we consider it a privelige to serve you.

## 2024-02-18 NOTE — Assessment & Plan Note (Signed)
 Reviewed vaccination status with potential need for hepatitis B and HPV vaccines. - Discussed potential need for hepatitis B and HPV vaccines. - Scheduled follow-up for physical examination and labs in four weeks.

## 2024-03-04 ENCOUNTER — Other Ambulatory Visit: Payer: Self-pay

## 2024-03-06 ENCOUNTER — Other Ambulatory Visit

## 2024-03-06 ENCOUNTER — Encounter: Payer: Self-pay | Admitting: Nurse Practitioner

## 2024-03-17 ENCOUNTER — Ambulatory Visit: Payer: Self-pay | Admitting: Nurse Practitioner

## 2024-04-16 ENCOUNTER — Ambulatory Visit: Admitting: Nurse Practitioner
